# Patient Record
Sex: Female | Born: 1940 | Race: White | Hispanic: No | Marital: Married
Health system: Southern US, Community
[De-identification: ages and names within clinical notes are randomized; demographics above are authoritative.]

## PROBLEM LIST (undated history)

## (undated) DIAGNOSIS — S42492A Other displaced fracture of lower end of left humerus, initial encounter for closed fracture: Secondary | ICD-10-CM

## (undated) DIAGNOSIS — Z1211 Encounter for screening for malignant neoplasm of colon: Secondary | ICD-10-CM

## (undated) DIAGNOSIS — S82002A Unspecified fracture of left patella, initial encounter for closed fracture: Secondary | ICD-10-CM

## (undated) DIAGNOSIS — R4182 Altered mental status, unspecified: Secondary | ICD-10-CM

---

## 1997-06-27 ENCOUNTER — Other Ambulatory Visit: Admission: RE | Admit: 1997-06-27 | Discharge: 1997-06-27 | Payer: Self-pay | Admitting: Gynecology

## 1998-10-17 ENCOUNTER — Ambulatory Visit (HOSPITAL_COMMUNITY): Admission: RE | Admit: 1998-10-17 | Discharge: 1998-10-17 | Payer: Self-pay | Admitting: Orthopedic Surgery

## 1999-03-19 ENCOUNTER — Encounter: Admission: RE | Admit: 1999-03-19 | Discharge: 1999-03-19 | Payer: Self-pay | Admitting: Gynecology

## 1999-03-19 ENCOUNTER — Encounter: Payer: Self-pay | Admitting: Gynecology

## 2000-02-03 ENCOUNTER — Other Ambulatory Visit: Admission: RE | Admit: 2000-02-03 | Discharge: 2000-02-03 | Payer: Self-pay | Admitting: Gynecology

## 2000-03-20 ENCOUNTER — Encounter: Admission: RE | Admit: 2000-03-20 | Discharge: 2000-03-20 | Payer: Self-pay | Admitting: Hematology & Oncology

## 2000-03-20 ENCOUNTER — Encounter: Payer: Self-pay | Admitting: Gynecology

## 2003-07-13 ENCOUNTER — Encounter: Admission: RE | Admit: 2003-07-13 | Discharge: 2003-07-13 | Payer: Self-pay | Admitting: Internal Medicine

## 2003-08-08 ENCOUNTER — Emergency Department (HOSPITAL_COMMUNITY): Admission: EM | Admit: 2003-08-08 | Discharge: 2003-08-08 | Payer: Self-pay | Admitting: Emergency Medicine

## 2004-03-29 ENCOUNTER — Ambulatory Visit (HOSPITAL_BASED_OUTPATIENT_CLINIC_OR_DEPARTMENT_OTHER): Admission: RE | Admit: 2004-03-29 | Discharge: 2004-03-29 | Payer: Self-pay | Admitting: Orthopedic Surgery

## 2004-03-29 ENCOUNTER — Ambulatory Visit (HOSPITAL_COMMUNITY): Admission: RE | Admit: 2004-03-29 | Discharge: 2004-03-29 | Payer: Self-pay | Admitting: Orthopedic Surgery

## 2004-09-07 IMAGING — CT CT HEAD W/O CM
2 of 3 series · 14 of 33 positions shown, 18 images · non-contrast
Comparison: none.

CLINICAL DATA: altered level of consciousness 
 CT HEAD WITHOUT CONTRAST

[Series 2: — · sagittal · 0.46mm/px · 1 of 3 slices shown (1 of 2)]
[im 2/3  brain]
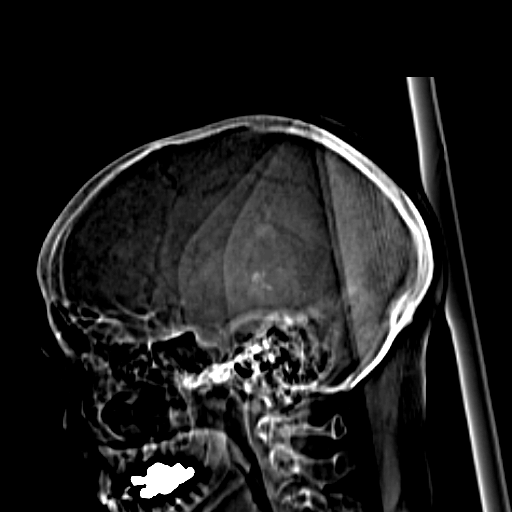

[Series 3: — · axial · 0.43mm/px · z∈[-162,-52]mm · 13 of 26 slices shown, 17 images (2 of 2)]
[im 2/26  brain]
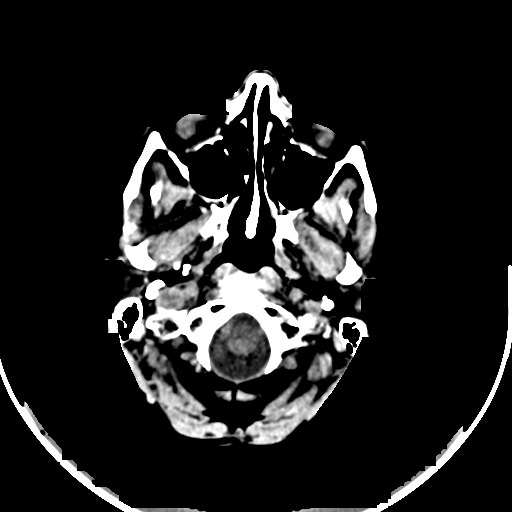
[im 2/26  bone]
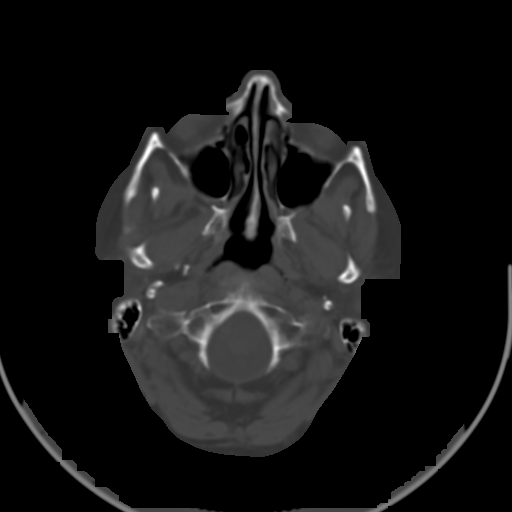
[im 4/26  brain]
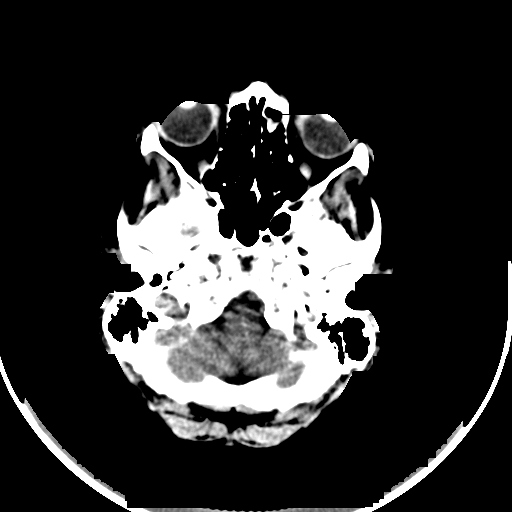
[im 6/26  brain]
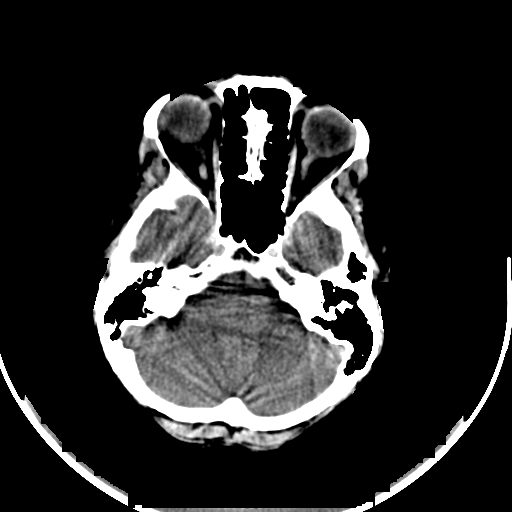
[im 8/26  brain]
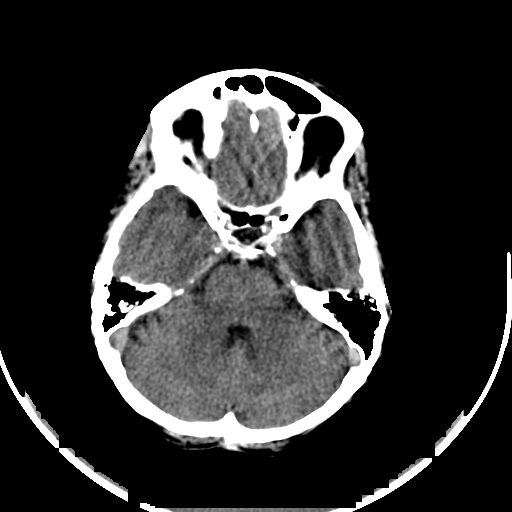
[im 9/26  brain]
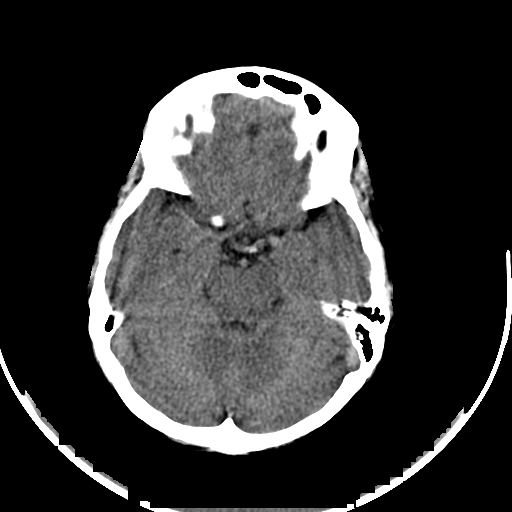
[im 9/26  bone]
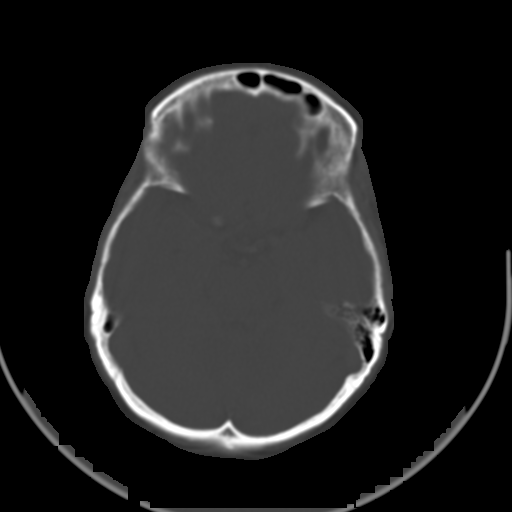
[im 11/26  brain]
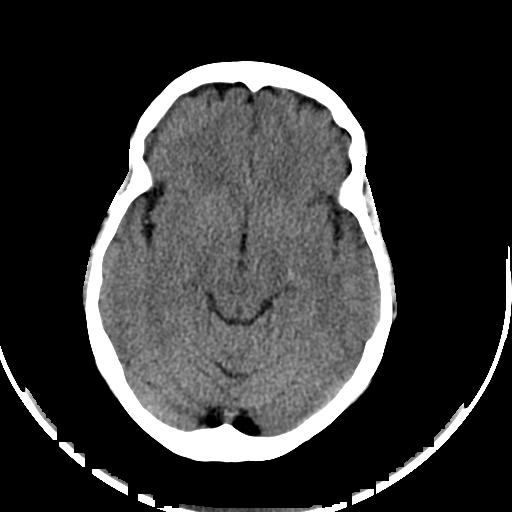
[im 13/26  brain]
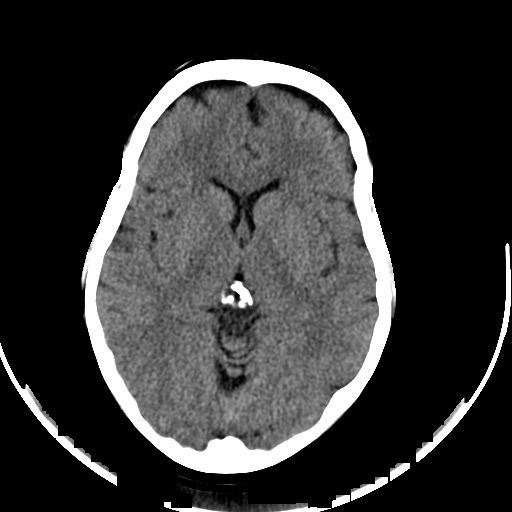
[im 15/26  brain]
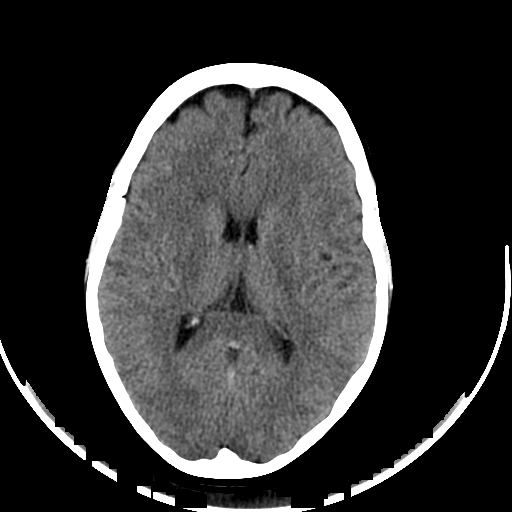
[im 17/26  brain]
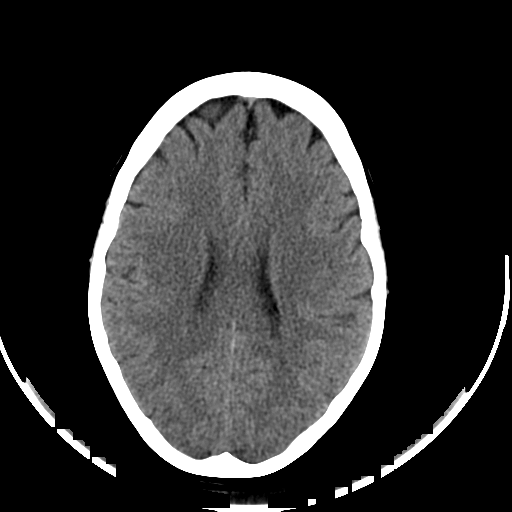
[im 17/26  bone]
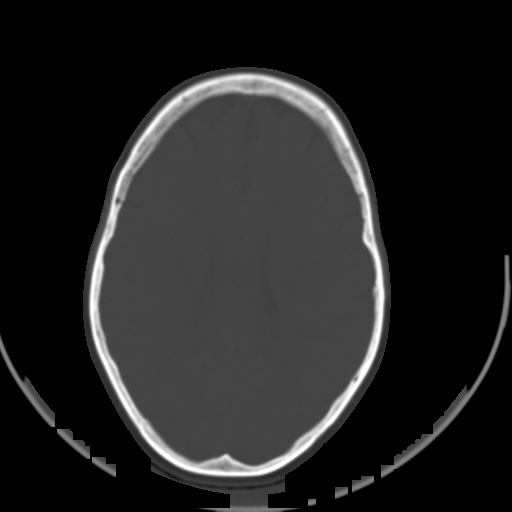
[im 18/26  brain]
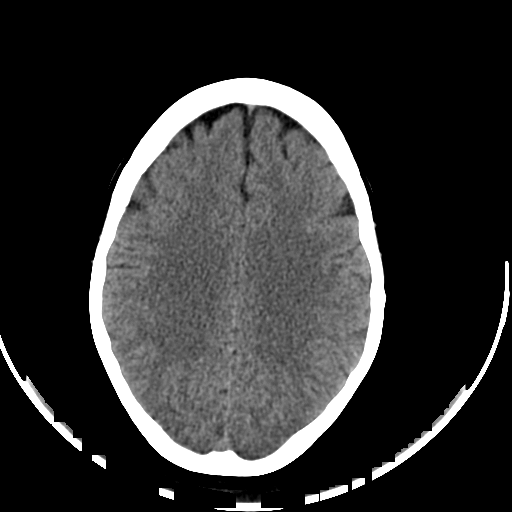
[im 20/26  brain]
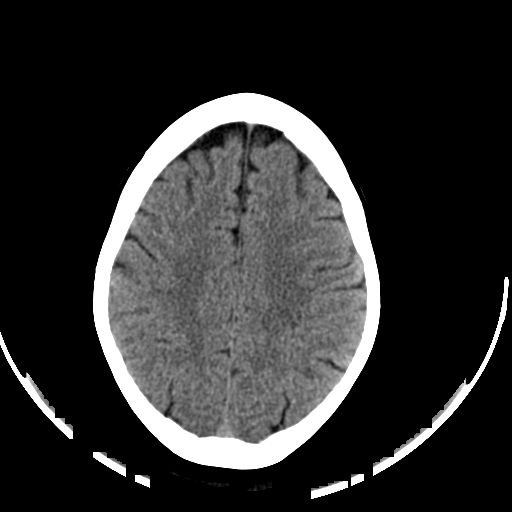
[im 22/26  brain]
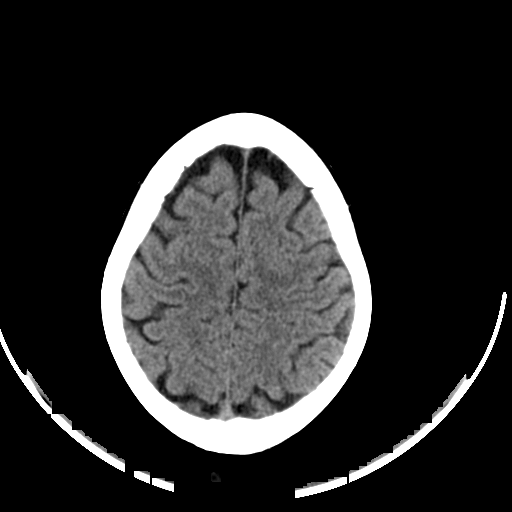
[im 24/26  brain]
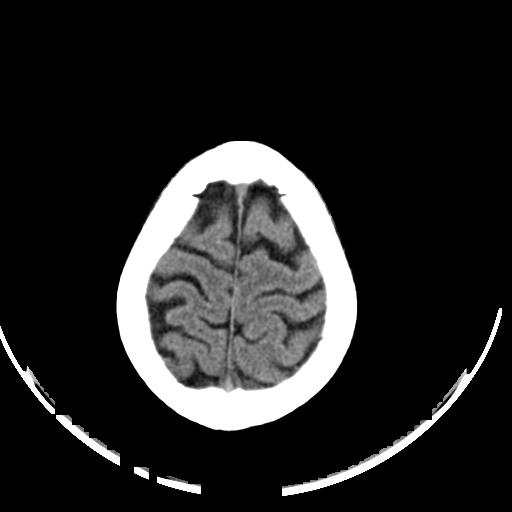
[im 24/26  bone]
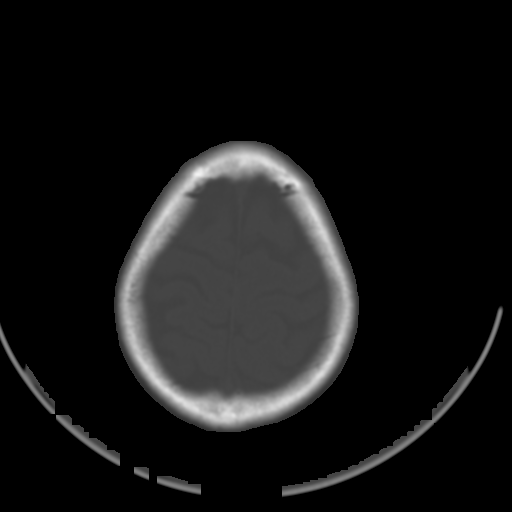

[14 of 33 positions shown; findings below may reference images not displayed]

Routine noncontrast head CT was performed. 

 There is no evidence of intracranial hemorrhage, brain edema, or mass effect. The ventricles are normal. No extra-axial abnormalities are identified. Bone windows show no significant abnormalities.

 IMPRESSION
 Negative noncontrast head CT.

## 2008-12-14 LAB — CBC WITH AUTOMATED DIFF
ABS. BASOPHILS: 0 10*3/uL (ref 0.0–0.1)
ABS. EOSINOPHILS: 0 10*3/uL (ref 0.0–0.4)
ABS. LYMPHOCYTES: 0.5 10*3/uL — ABNORMAL LOW (ref 0.8–3.5)
ABS. MONOCYTES: 0.3 10*3/uL (ref 0.0–1.0)
ABS. NEUTROPHILS: 5.7 10*3/uL (ref 1.8–8.0)
BASOPHILS: 0 % (ref 0–1)
EOSINOPHILS: 0 % (ref 0–7)
HCT: 44.2 % (ref 35.0–47.0)
HGB: 15 g/dL (ref 11.5–16.0)
LYMPHOCYTES: 8 % — ABNORMAL LOW (ref 12–49)
MCH: 30.8 PG (ref 26.0–34.0)
MCHC: 33.9 g/dL (ref 30.0–36.5)
MCV: 90.8 FL (ref 80.0–99.0)
MONOCYTES: 4 % — ABNORMAL LOW (ref 5–13)
NEUTROPHILS: 88 % — ABNORMAL HIGH (ref 32–75)
PLATELET: 314 10*3/uL (ref 150–400)
RBC: 4.87 M/uL (ref 3.80–5.20)
RDW: 12.8 % (ref 11.5–14.5)
WBC: 6.5 10*3/uL (ref 3.6–11.0)

## 2008-12-14 LAB — METABOLIC PANEL, COMPREHENSIVE
A-G Ratio: 1.3 (ref 1.1–2.2)
ALT (SGPT): 21 U/L (ref 12–78)
AST (SGOT): 17 U/L (ref 15–37)
Albumin: 4.6 g/dL (ref 3.5–5.0)
Alk. phosphatase: 77 U/L (ref 50–136)
Anion gap: 10 mmol/L (ref 5–15)
BUN/Creatinine ratio: 24 — ABNORMAL HIGH (ref 12–20)
BUN: 19 MG/DL (ref 6–20)
Bilirubin, total: 0.4 MG/DL (ref 0.2–1.0)
CO2: 24 MMOL/L (ref 21–32)
Calcium: 9.8 MG/DL (ref 8.5–10.1)
Chloride: 101 MMOL/L (ref 97–108)
Creatinine: 0.8 MG/DL (ref 0.6–1.3)
GFR est AA: >60 mL/min/{1.73_m2} (ref >60)
GFR est non-AA: >60 mL/min/{1.73_m2} (ref >60)
Globulin: 3.5 g/dL (ref 2.0–4.0)
Glucose: 130 MG/DL — ABNORMAL HIGH (ref 65–100)
Potassium: 3.5 MMOL/L (ref 3.5–5.1)
Protein, total: 8.1 g/dL (ref 6.4–8.2)
Sodium: 135 MMOL/L — ABNORMAL LOW (ref 136–145)

## 2008-12-14 LAB — CK W/ REFLX CKMB: CK: 65 U/L (ref 21–215)

## 2008-12-14 LAB — TROPONIN I: Troponin-I, Qt.: <0.04 ng/mL (ref <0.05)

## 2008-12-14 LAB — PROTHROMBIN TIME + INR
INR: 1.1 (ref 0.9–1.1)
Prothrombin time: 11.3 SECS — ABNORMAL HIGH (ref 9.0–11.0)

## 2008-12-14 LAB — PTT: aPTT: 26.7 s (ref 24.0–33.0)

## 2008-12-14 NOTE — H&P (Signed)
Name: Brandy White, Brandy White Admitted: 12/14/2008  MR #: [id number] DOB: November 29, 1940  Account #: [id number] Age: 68  Physician: Burt Ek, MD Location:     HISTORY PHYSICAL      PRIMARY CARE PROVIDER: Palma Holter, MD    SOURCE OF INFORMATION: Patient.    CHIEF COMPLAINT: Vertigo.    HISTORY OF PRESENT ILLNESS: A 68 year old female with a history of  hyperlipidemia, orthostatic hypotension, leading to multiple syncopal  episodes in 2006 with negative workup, now on fludrocortisone, vertigo,  depression, presents with vertigo. She was in her usual state of health  until yesterday around 5 p.m., when she developed severe progressive  vertigo associated with nausea. Today, around 1 p.m., symptoms were  associated with severe generalized headache, which she described as the  worst headache of her life. It was subsequently followed with several  episodes of emesis and retching with the last emesis becoming  blood-streaked. This prompted her to visit an urgent care center, where she  was found with a systolic blood pressure of 170/100 and tachycardic to the  130s, prompting them to send her to the emergency department.    She also reports an associated diplopia. She is denying a history of  hypertension, a history of stroke, history of diabetes mellitus. There has  been no tinnitus or hearing loss. Of note, is the fact that over the past 4  to 5 months, she has had several episodes of vertigo associated with  nausea. She is also denying any recent upper respiratory tract infection,  except for a few days of rhinorrhea. There has been no chest pain or  dyspnea. No GI symptoms. No GU symptoms.    REVIEW OF SYSTEMS: All 10 systems have been reviewed and is negative  except for what has been mentioned in HPI.    PAST MEDICAL HISTORY  1. Several syncopal episodes in 2006. This was eventually attributed to  orthostatic hypotension and the patient was started on fludrocortisone for   this. During that time, she underwent 2 stress tests, which were negative.  She also underwent a cardiac catheterization, which was normal and  tilt-table testing, which was also negative. Her workup occurred at [facility].  2. Hyperlipidemia, which is controlled.  3. Osteoporosis.  4. GERD.  5. Depression.    PAST SURGICAL HISTORY  1. TAH/BSO at age 28 for abnormal uterine bleeding.  2. Wrist fracture in 2006 with titanium rod placement. She sustained this  fracture after a syncopal episode.    SOCIAL HISTORY: She lives at home with her husband. She is employed as a  IT consultant. There has been no occupational exposures. She denies tobacco use  or recreational drug use. She drinks occasionally.    FAMILY HISTORY: Mother had a stroke at age 18. She is deceased at age 56.  Father died young from suicide. A brother had a myocardial infarction in  his 72s.    ALLERGIES: NO KNOWN DRUG ALLERGIES.    MEDICATIONS  1. Lipitor. The patient cannot remember the dose, but she believes it is  either 10 or 20 mg daily.  2. Fosamax 70 mg p.o. every week on Mondays.  3. Aspirin 81 mg p.o. daily.  4. Prilosec OTC daily. Repeat dose unknown.  5. Fludrocortisone dose unknown.    PHYSICAL EXAMINATION  VITAL SIGNS: Temperature 97.9, pulse 101, blood pressure 177/115,  respiratory rate 14, O2 95% on room air.  GENERAL: The patient was seen on the ED stretcher. She was not in acute  distress. She is speaking in full sentences.  HEENT: PERRL, EOMI. Oropharynx clear.  NECK: Supple.  CARDIOVASCULAR: S1, S2. Normal rate. Regular rhythm.  PULMONARY: Clear to auscultation bilaterally.  ABDOMEN: Bowel sounds are present, soft, nontender, nondistended.  EXTREMITIES: Are warm. There is no edema.  NEUROLOGIC: Cranial nerves 2-12 intact. Strength 5/5 bilateral upper  extremities. Normal sensory exam. Finger-to-nose testing is normal.    LABORATORY DATA: WBC 6.5, hemoglobin 15.0, hematocrit 44.2, platelets 314,   neutrophils 88, lymphocytes 8, monos 4, eos 0, basophils 0. INR 1.1. PTT  26.7, sodium 135, potassium 3.5, chloride 101, CO2 24, BUN 19, creatinine  0.8, glucose 130, total protein 8.1, albumin 4.6, total bilirubin 0.47,  alkaline phosphatase 77, AST 17, ALT 21, troponin less than 0.04. CPK 65.    RADIOLOGY: Chest x-ray: No acute disease.    CT head: Negative exam.    ASSESSMENT AND PLAN  A 68 year old female with vertigo and nausea over the past 5 months,  presents with an acute onset of vertigo associated with severe headache,  nausea and vomiting. Differential diagnosis includes benign positional  vertigo, which is a likely possibility as patient's vertigo has been  positional and she is feeling much better now in the ED after receiving  meclizine, Zofran and morphine in the emergency department. Other real  differentials include vertebral basilar insufficiency, posterior  circulation stroke, which is also a possibility considering her family  history of early stroke and cardiovascular disease, labyrinthitis could be  a possibility in the setting of recent rhinorrhea, though she does not  describe any classic symptoms of an upper viral respiratory tract  infection. The plan for management, includes ruling out the most  life-threatening causes of her symptoms, which is a stroke. We will obtain  an MRI, MRA brain, carotid Doppler, obtain a 2D echo. We will resume her  statin and aspirin. Also obtain a neurological evaluation. In the interim,  we will treat her symptoms with meclizine as needed.  2. Hyperlipidemia-resume her Lipitor.  3. Gastroesophageal reflux disease. Resume her proton pump inhibitor.  4. Osteoporosis resume Fosamax.  5. Depression. Resume home meds once the dosage is available.        E-Signed By  Burt Ek, MD 01/17/2009 13:25    Burt Ek, MD    cc: Burt Ek, MD        [place]; D: 12/14/2008 9:22 P; T: 12/14/2008 11:03 P; DOC# [id number]; Job#  [id number]

## 2008-12-15 LAB — CBC WITH AUTOMATED DIFF
ABS. BASOPHILS: 0 10*3/uL (ref 0.0–0.1)
ABS. EOSINOPHILS: 0 10*3/uL (ref 0.0–0.4)
ABS. LYMPHOCYTES: 1 10*3/uL (ref 0.8–3.5)
ABS. MONOCYTES: 0.6 10*3/uL (ref 0.0–1.0)
ABS. NEUTROPHILS: 4.4 10*3/uL (ref 1.8–8.0)
BASOPHILS: 1 % (ref 0–1)
EOSINOPHILS: 1 % (ref 0–7)
HCT: 38.7 % (ref 35.0–47.0)
HGB: 12.7 g/dL (ref 11.5–16.0)
LYMPHOCYTES: 17 % (ref 12–49)
MCH: 30.2 PG (ref 26.0–34.0)
MCHC: 32.8 g/dL (ref 30.0–36.5)
MCV: 92.1 FL (ref 80.0–99.0)
MONOCYTES: 10 % (ref 5–13)
NEUTROPHILS: 71 % (ref 32–75)
PLATELET: 298 10*3/uL (ref 150–400)
RBC: 4.2 M/uL (ref 3.80–5.20)
RDW: 12.9 % (ref 11.5–14.5)
WBC: 6.1 10*3/uL (ref 3.6–11.0)

## 2008-12-15 LAB — METABOLIC PANEL, BASIC
Anion gap: 10 mmol/L (ref 5–15)
BUN/Creatinine ratio: 20 (ref 12–20)
BUN: 14 MG/DL (ref 6–20)
CO2: 27 MMOL/L (ref 21–32)
Calcium: 8.6 MG/DL (ref 8.5–10.1)
Chloride: 105 MMOL/L (ref 97–108)
Creatinine: 0.7 MG/DL (ref 0.6–1.3)
GFR est AA: >60 mL/min/{1.73_m2} (ref >60)
GFR est non-AA: >60 mL/min/{1.73_m2} (ref >60)
Glucose: 84 MG/DL (ref 65–100)
Potassium: 3.6 MMOL/L (ref 3.5–5.1)
Sodium: 142 MMOL/L (ref 136–145)

## 2008-12-15 LAB — PHOSPHORUS: Phosphorus: 4.2 MG/DL (ref 2.5–4.9)

## 2008-12-15 LAB — MAGNESIUM: Magnesium: 2.2 MG/DL (ref 1.6–2.4)

## 2008-12-15 NOTE — Consults (Addendum)
Name: Brandy White, Brandy White Admitted: 12/14/2008  MR #: [id number] DOB: 07-19-1940  Account #: [id number] Age: 68  Consultant: Nestor Lewandowsky, MD Location: [facility]     CONSULTATION REPORT    DATE OF CONSULTATION: 12/15/2008      CHIEF COMPLAINT: Dizziness.    HISTORY OF PRESENT ILLNESS: The patient is a 68 year old woman for whom  consultation has been requested by the hospitalist regarding the above  problem. The patient reports that for a few weeks she has been having some  episodes of vertigo which seem to be gradually getting worse. The day  before yesterday, she developed vertigo in the evening. She developed  blurred vision and double vision, which she describes as a shadow if she  looks at numbers on a page. The next day the vertigo worsened and she  developed a severe, diffuse headache with nausea and vomiting. The  dizziness is worse if she turns her head in any direction. She still has  some headache and dizziness, although both are doing a little bit better  today. She denies any focal neurological symptoms. She feels weak in  general. No recent viral symptoms.    She does have a history of syncope with an extensive workup, finally  diagnosed as orthostatic hypotension. She saw Dr. Woodroe Chen from ENT at that  time. She denies much in the way of headaches in the past, although she did  have some headaches with these prior episodes of vertigo in the last few  weeks.    PAST MEDICAL HISTORY  1. Syncope and orthostatic hypotension.  2. Hyperlipidemia.  3. Gastroesophageal reflux disease.  4. Depression.  5. Hysterectomy.  6. Right wrist fracture in the setting of a syncopal episode.    SOCIAL HISTORY: No tobacco or significant alcohol.    FAMILY HISTORY: Mother had a stroke.    ALLERGIES: NONE.    MEDICATIONS  1. Lipitor 20 mg daily.  2. Fosamax 70 mg weekly.  3. Prilosec OTC.  4. Fludrocortisone.  5. Aspirin 81 mg daily.  6. She has been treated with meclizine here as well.     REVIEW OF SYSTEMS: As noted in HPI and PMH. No fevers. Does not sleep  particularly well. All other systems are reviewed and are negative.    PHYSICAL EXAMINATION  GENERAL: A middle-aged, properly groomed woman.  VITAL SIGNS: Temperature 98.4, pulse 62, respirations 18, blood pressure  115/98.  NECK: Without bruits.  HEART: Regular rate and rhythm.  FUNDI: Poorly visualized.  NEUROLOGICAL: Alert, fully oriented, attentive and without aphasia. Full  fund of knowledge. Normal recent and remote memory. Pupils equal, round,  and reactive to light. Visual fields full. Extraocular movements intact.  Face symmetric. Tongue midline. Palate elevates symmetrically. Normal  sensation on the face. No drift. Normal coordination. Normal tone, bulk,  and strength. DTRs 2+ throughout with absent ankle jerks bilaterally. Toes  downgoing. Normal temperature and vibration sensation. Her gait is a little  bit slow but fairly steady. Dix-Hallpike maneuver produces some vertigo  with her head in both directions, but I do not see any nystagmus.    DATA: Hospital records were reviewed. They describe dizziness and severe  headache.    Laboratory studies were unremarkable.    Images and report from a CT scan of the head were reviewed. This was  normal. Carotid Dopplers show 0% to 49% stenosis on the left and a normal  right carotid artery.    IMPRESSION  1. Episodes of vertigo,  headache and diplopia. Could be benign positional  vertigo, although the very severe headache would be unusual. Migraine  associated vertigo is also possible but a diagnosis of exclusion,  particularly in somebody this age. We should rule out stroke. Need to rule  out aneurysm.  2. History of orthostatic hypotension.  3. Dyslipidemia.    PLAN  1. MRI scan of the brain and MRA of the head.  2. Continue aspirin and Lipitor.  3. Meclizine p.r.n.  4. If the MRI is negative, then she may need outpatient followup with ENT  for further evaluation.         Reviewed on 12/15/2008 12:22 PM      E-Signed By  Nestor Lewandowsky, MD 12/15/2008 13:58    Feliciana Rossetti, Montez Hageman, MD    cc: D. Palma Holter, MD   Nestor Lewandowsky, MD    HOSPITALIST TEAM 2    JJW/WMX; D: 12/15/2008 11:07 A; T: 12/15/2008 12:15 P; DOC# [id number]; Job#  [id number]

## 2008-12-16 LAB — CBC WITH AUTOMATED DIFF
ABS. BASOPHILS: 0 10*3/uL (ref 0.0–0.1)
ABS. EOSINOPHILS: 0.2 10*3/uL (ref 0.0–0.4)
ABS. LYMPHOCYTES: 1.1 10*3/uL (ref 0.8–3.5)
ABS. MONOCYTES: 0.5 10*3/uL (ref 0.0–1.0)
ABS. NEUTROPHILS: 2.9 10*3/uL (ref 1.8–8.0)
BASOPHILS: 1 % (ref 0–1)
EOSINOPHILS: 4 % (ref 0–7)
HCT: 42.6 % (ref 35.0–47.0)
HGB: 13.7 g/dL (ref 11.5–16.0)
LYMPHOCYTES: 23 % (ref 12–49)
MCH: 30.2 PG (ref 26.0–34.0)
MCHC: 32.2 g/dL (ref 30.0–36.5)
MCV: 94 FL (ref 80.0–99.0)
MONOCYTES: 10 % (ref 5–13)
NEUTROPHILS: 62 % (ref 32–75)
PLATELET: 284 10*3/uL (ref 150–400)
RBC: 4.53 M/uL (ref 3.80–5.20)
RDW: 13.1 % (ref 11.5–14.5)
WBC: 4.8 10*3/uL (ref 3.6–11.0)

## 2008-12-16 LAB — METABOLIC PANEL, COMPREHENSIVE
A-G Ratio: 1.1 (ref 1.1–2.2)
ALT (SGPT): 17 U/L (ref 12–78)
AST (SGOT): 12 U/L — ABNORMAL LOW (ref 15–37)
Albumin: 3.5 g/dL (ref 3.5–5.0)
Alk. phosphatase: 76 U/L (ref 50–136)
Anion gap: 12 mmol/L (ref 5–15)
BUN/Creatinine ratio: 19 (ref 12–20)
BUN: 17 MG/DL (ref 6–20)
Bilirubin, total: 0.4 MG/DL (ref 0.2–1.0)
CO2: 26 MMOL/L (ref 21–32)
Calcium: 8.9 MG/DL (ref 8.5–10.1)
Chloride: 105 MMOL/L (ref 97–108)
Creatinine: 0.9 MG/DL (ref 0.6–1.3)
GFR est AA: >60 mL/min/{1.73_m2} (ref >60)
GFR est non-AA: >60 mL/min/{1.73_m2} (ref >60)
Globulin: 3.2 g/dL (ref 2.0–4.0)
Glucose: 77 MG/DL (ref 65–100)
Potassium: 3.8 MMOL/L (ref 3.5–5.1)
Protein, total: 6.7 g/dL (ref 6.4–8.2)
Sodium: 143 MMOL/L (ref 136–145)

## 2008-12-16 NOTE — Discharge Summary (Signed)
Name: Brandy White, Brandy White Admitted: 12/14/2008  MR #: [id number] Discharged: 12/16/2008  Account #: [id number] DOB: 08-02-1940  Physician: Ubaldo Glassing, MD Age 68     DISCHARGE SUMMARY      DIAGNOSES  PRINCIPAL DIAGNOSIS: Vertigo.    OTHER DIAGNOSES  1. His syncopal episode secondary to orthostatic hypotension.  2. Hyperlipidemia.  3. Osteoporosis.  4. Gastroesophageal reflux disease.  5. Depression.    DISCHARGE MEDICATIONS  NEW: Meclizine 25 mg q.8 h. as needed.    UNCHANGED  1. Lipitor, dose unknown.  2. Fosamax 70 mg q. weekly.  3. Aspirin 81 mg daily.  4. Prilosec OTC daily, dose unknown.  5. Fludrocortisone, dose unknown.    FOLLOW-UP CARE: The patient will follow up with Dr. Silvano Bilis, ENT, upon  discharge.    DISPOSITION: The patient is being discharged to home.    IMMUNIZATIONS: Immunization at admission is not available at the time of  this discharge summary dictation.    ADVANCED DIRECTIVES: She is a FULL CODE.    PATIENT INSTRUCTIONS/LIMITATIONS  DIET: Cardiac prudent.  ACTIVITY: Fall precautions.    REASON FOR ADMISSION: Headache, nausea, dizziness, and diplopia.    BRIEF ADMISSION PHYSICAL EXAMINATION ON ADMISSION  VITAL SIGNS: Temperature 97.9, heart rate 101 beats per minute, blood  pressure 177/115, respiratory rate 14, oxygen saturation 95% on room air.  GENERAL: No acute distress. Speaking in full sentences.  NECK: supple.  CARDIOVASCULAR: Normal rate, regular rhythm.  PULMONARY: Clear to auscultation bilaterally.  NEUROLOGIC: Cranial nerves 2 through 12 intact. Strength 5/5 bilateral  upper extremities. Normal sensory exam. Finger-to-nose testing normal.    CONSULTANTS: The patient was seen in consultation by Dr. Maurene Capes,  Neurology.    IMPORTANT LABS AND X-RAYS: A 2-D echocardiogram revealed normal systolic  function with an ejection fraction of 55% to 60%. No regional wall motion  abnormalities.    MRI and MRA of the brain were unremarkable.     Carotid Dopplers revealed 0% to 49% stenosis in the left internal carotid.  Thyroid appeared abnormal with a hypoechoic area on the right, possible  cyst. No evidence of right carotid stenosis.    ASSESSMENT/PLAN: Vertigo. The patient was seen in consultation by  Neurology. A workup was unremarkable. The vertigo was felt to be likely  related to an inner ear disorder. ENT followup was recommended in the  outpatient setting. She will also be given a prescription for meclizine to  take as needed for the dizziness.    On behalf of [employer], thank you for allowing me to  participate in the care of this patient.            E-Signed By  Ubaldo Glassing, MD 01/04/2009 17:47    Ubaldo Glassing, MD    cc: Ubaldo Glassing, MD        MG/WMX; D: 12/16/2008 10:27 A; T: 12/16/2008 10:09 P; DOC# [id number]; Job#  [id number]

## 2012-03-09 ENCOUNTER — Encounter: Payer: Self-pay | Admitting: Internal Medicine

## 2013-03-03 ENCOUNTER — Ambulatory Visit: Payer: Medicare Other | Admitting: Physical Therapy

## 2014-03-28 ENCOUNTER — Inpatient Hospital Stay: Payer: MEDICARE

## 2014-03-28 MED ORDER — NALOXONE 0.4 MG/ML INJECTION
0.4 mg/mL | INTRAMUSCULAR | Status: DC | PRN
Start: 2014-03-28 — End: 2014-03-28

## 2014-03-28 MED ORDER — SODIUM CHLORIDE 0.9 % IV
INTRAVENOUS | Status: DC
Start: 2014-03-28 — End: 2014-03-28
  Administered 2014-03-28: 14:00:00 via INTRAVENOUS

## 2014-03-28 MED ORDER — LACTATED RINGERS IV
INTRAVENOUS | Status: DC | PRN
Start: 2014-03-28 — End: 2014-03-28
  Administered 2014-03-28: 14:00:00 via INTRAVENOUS

## 2014-03-28 MED ORDER — MIDAZOLAM 1 MG/ML IJ SOLN
1 mg/mL | INTRAMUSCULAR | Status: DC | PRN
Start: 2014-03-28 — End: 2014-03-28

## 2014-03-28 MED ORDER — PROPOFOL 10 MG/ML IV EMUL
10 mg/mL | INTRAVENOUS | Status: DC | PRN
Start: 2014-03-28 — End: 2014-03-28
  Administered 2014-03-28 (×3): via INTRAVENOUS

## 2014-03-28 MED ORDER — FENTANYL CITRATE (PF) 50 MCG/ML IJ SOLN
50 mcg/mL | INTRAMUSCULAR | Status: DC | PRN
Start: 2014-03-28 — End: 2014-03-28

## 2014-03-28 MED ORDER — SODIUM CHLORIDE 0.9 % IJ SYRG
INTRAMUSCULAR | Status: DC | PRN
Start: 2014-03-28 — End: 2014-03-28

## 2014-03-28 MED ORDER — FLUMAZENIL 0.1 MG/ML IV SOLN
0.1 mg/mL | INTRAVENOUS | Status: DC | PRN
Start: 2014-03-28 — End: 2014-03-28

## 2014-03-28 MED ORDER — ATROPINE 0.1 MG/ML SYRINGE
0.1 mg/mL | Freq: Once | INTRAMUSCULAR | Status: DC | PRN
Start: 2014-03-28 — End: 2014-03-28

## 2014-03-28 MED ORDER — EPINEPHRINE 0.1 MG/ML SYRINGE
0.1 mg/mL | Freq: Once | INTRAMUSCULAR | Status: DC | PRN
Start: 2014-03-28 — End: 2014-03-28

## 2014-03-28 MED ORDER — SODIUM CHLORIDE 0.9 % IJ SYRG
Freq: Three times a day (TID) | INTRAMUSCULAR | Status: DC
Start: 2014-03-28 — End: 2014-03-28

## 2014-03-28 MED ORDER — FENTANYL CITRATE (PF) 50 MCG/ML IJ SOLN
50 mcg/mL | INTRAMUSCULAR | Status: DC | PRN
Start: 2014-03-28 — End: 2014-03-28
  Administered 2014-03-28: 14:00:00 via INTRAVENOUS

## 2014-03-28 MED ORDER — SIMETHICONE 40 MG/0.6 ML ORAL DROPS, SUSP
40 mg/0.6 mL | ORAL | Status: DC | PRN
Start: 2014-03-28 — End: 2014-03-28

## 2014-03-28 MED FILL — DIPRIVAN 10 MG/ML INTRAVENOUS EMULSION: 10 mg/mL | INTRAVENOUS | Qty: 24

## 2014-03-28 MED FILL — SODIUM CHLORIDE 0.9 % IV: INTRAVENOUS | Qty: 200

## 2014-03-28 MED FILL — SODIUM CHLORIDE 0.9 % IV: INTRAVENOUS | Qty: 1000

## 2014-03-28 NOTE — Procedures (Signed)
[facility]  [address]  [place], [place] [id number]                 Colonoscopy Procedure Note    Indications:   See Preoperative Diagnosis above  Referring Physician: Theresia LoANIEL WESLEY DAVID, MD  Anesthesia/Sedation: MAC anesthesia Propofol  Endoscopist:  Dr. Alma DownsPuneet Tationna Fullard  Assistant:  Endoscopy Technician-1: Barkley BoardsJoe Durham  Endoscopy RN-1: Donneta RombergMichele M Feeney, RN    Procedure in Detail:  Informed consent was obtained for the procedure, including sedation.  Risks of perforation, hemorrhage, adverse drug reaction, and aspiration were discussed. The patient was placed in the left lateral decubitus position.  Based on the pre-procedure assessment, including review of the patient's medical history, medications, allergies, and review of systems, she had been deemed to be an appropriate candidate for moderate sedation; she was therefore sedated with the medications listed above.   The patient was monitored continuously with ECG tracing, pulse oximetry, blood pressure monitoring, and direct observations.      A rectal examination was performed. The CFQ180AL was inserted into the rectum and advanced under direct vision to the cecum, which was identified by the ileocecal valve and appendiceal orifice.  The quality of the colonic preparation was good.  A careful inspection was made as the colonoscope was withdrawn, including a retroflexed view of the rectum; findings and interventions are described below.  Appropriate photodocumentation was obtained.    Findings:  Rectum: normal  Sigmoid: normal  Descending Colon: normal  Transverse Colon: normal  Ascending Colon: normal  Cecum: normal      Specimens:    none    EBL: None    Complications: None; patient tolerated the procedure well.    Recommendations:     - Repeat colonoscopy in 5 years.    Signed By: Alma DownsPuneet  Sherre Wooton, MD                        March 28, 2014

## 2014-03-28 NOTE — H&P (Signed)
Colonoscopy History and Physical      The patient was seen and examined.Date of last colonoscopy: none, Polyps  No      The airway was assessed and documented.  The problem list, past medical history, and medications were reviewed.     There is no problem list on file for this patient.    History     Social History   ??? Marital Status: WIDOWED     Spouse Name: N/A     Number of Children: N/A   ??? Years of Education: N/A     Occupational History   ??? Not on file.     Social History Main Topics   ??? Smoking status: Never Smoker    ??? Smokeless tobacco: Not on file   ??? Alcohol Use: 2.4 oz/week     4 Glasses of wine per week   ??? Drug Use: Not on file   ??? Sexual Activity: Not on file     Other Topics Concern   ??? Not on file     Social History Narrative     Past Medical History   Diagnosis Date   ??? GERD (gastroesophageal reflux disease)    ??? Hypertension    ??? Ill-defined condition      increased cholesterol   ??? Ill-defined condition      osteoporosis/now osteopenia   ??? Adverse effect of anesthesia      takes longer to wake up per pt         Prior to Admission Medications   Prescriptions Last Dose Informant Patient Reported? Taking?   ASPIRIN PO 03/22/2014  Yes Yes   Sig: Take 81 mg by mouth daily.   Desvenlafaxine 100 mg Tb24 03/27/2014 at Unknown time  Yes Yes   Sig: Take 100 mg by mouth daily.   Hydrochlorothiazide (HYDRODIURIL) 12.5 mg tablet 03/27/2014 at Unknown time  Yes Yes   Sig: Take 12.5 mg by mouth daily.   MULTIVITAMIN PO 03/27/2014 at Unknown time  Yes Yes   Sig: Take  by mouth. Takes one po daily.   alendronate (FOSAMAX) 70 mg tablet   Yes Yes   Sig: Take 70 mg by mouth every Sunday.   atorvastatin (LIPITOR) 20 mg tablet 03/27/2014 at Unknown time  Yes Yes   Sig: Take 20 mg by mouth daily.   cholecalciferol, vitamin D3, 2,000 unit tab 03/27/2014 at Unknown time  Yes Yes   Sig: Take 2,000 Units by mouth daily.   cyanocobalamin (VITAMIN B12) 100 mcg tablet 03/27/2014 at Unknown time  Yes Yes    Sig: Take 100 mcg by mouth daily.   fludrocortisone (FLORINEF) 0.1 mg tablet 03/27/2014 at Unknown time  Yes Yes   Sig: Take 0.1 mg by mouth daily.   omeprazole (PRILOSEC OTC) 20 mg tablet 03/27/2014 at Unknown time  Yes Yes   Sig: Take 20 mg by mouth daily.      Facility-Administered Medications: None       The patient was seen and examined in the endoscopy suite. The airway was assessed and docuemented. The problem list and medications were reviewed.     Chief complaint, history of present illness, and review of systems and Past medical History are positive for: HTN, GERD and family history of colon cancer    The heart, lungs and mental status were satisfactory for the administration of sedation and for the procedure.     I discussed with the patient the objectives, risks, consequences and alternatives to the  procedure.     Plan: Endoscopic procedure with sedation     Alma Downs, MD   03/28/2014  9:21 AM

## 2014-03-28 NOTE — Other (Signed)
Brandy White  03-May-1940  [id number]    Situation:  Verbal report received from: M. Alonna BucklerFeeney, RN  Procedure: Procedure(s):  COLONOSCOPY    Background:    Preoperative diagnosis: FAM HX OF COLON CANCER  Postoperative diagnosis: hemorrhoids    Operator:  Dr. Lucianne MussKumar  Assistant(s): Endoscopy Technician-1: Barkley BoardsJoe Durham  Endoscopy RN-1: Donneta RombergMichele M Feeney, RN    Specimens: * No specimens in log *  H. Pylori  no    Assessment:  Intra-procedure medications     Anesthesia gave intra-procedure sedation and medications, see anesthesia flow sheet yes    Intravenous fluids: NS@ KVO     Vital signs stable     Abdominal assessment: round and soft     Recommendation:  Discharge patient per MD order.    Family or Friend   Permission to share finding with family or friend yes

## 2014-03-28 NOTE — Anesthesia Post-Procedure Evaluation (Signed)
Post-Anesthesia Evaluation and Assessment    Patient: Sherlon HandingFrances C Mcqueary MRN: [id number]  SSN: [id number]    Date of Birth: October 13, 1940  Age: 74 y.o.  Sex: female       Cardiovascular Function/Vital Signs  Visit Vitals   Item Reading   ??? BP 159/76 mmHg   ??? Pulse 59   ??? Temp 36.4 ??C (97.5 ??F)   ??? Resp 18   ??? Ht 5\' 1"  (1.549 m)   ??? Wt 72.576 kg (160 lb)   ??? BMI 30.25 kg/m2   ??? SpO2 98%   ??? Breastfeeding No       Patient is status post MAC anesthesia for Procedure(s):  COLONOSCOPY.    Nausea/Vomiting: None    Postoperative hydration reviewed and adequate.    Pain:  Pain Scale 1: Numeric (0 - 10) (03/28/14 1010)  Pain Intensity 1: 0 (03/28/14 1010)   Managed    Neurological Status:       At baseline    Mental Status and Level of Consciousness: Alert and oriented     Pulmonary Status:   O2 Device: Room air (03/28/14 1010)   Adequate oxygenation and airway patent    Complications related to anesthesia: None    Post-anesthesia assessment completed. No concerns    Signed By: Rexene EdisonMary K Darryel Diodato, MD     March 28, 2014

## 2014-03-28 NOTE — Anesthesia Pre-Procedure Evaluation (Signed)
Anesthetic History   No history of anesthetic complications            Review of Systems / Medical History  Patient summary reviewed, nursing notes reviewed and pertinent labs reviewed    Pulmonary  Within defined limits                 Neuro/Psych   Within defined limits           Cardiovascular    Hypertension          Hyperlipidemia         GI/Hepatic/Renal     GERD           Endo/Other  Within defined limits           Other Findings              Physical Exam    Airway  Mallampati: II  TM Distance: 4 - 6 cm  Neck ROM: normal range of motion   Mouth opening: Normal     Cardiovascular  Regular rate and rhythm,  S1 and S2 normal,  no murmur, click, rub, or gallop             Dental  No notable dental hx       Pulmonary  Breath sounds clear to auscultation               Abdominal  GI exam deferred       Other Findings            Anesthetic Plan    ASA: 2  Anesthesia type: MAC          Induction: Intravenous  Anesthetic plan and risks discussed with: Patient

## 2014-03-28 NOTE — Procedures (Signed)
Procedures  by Alma DownsKumar, Taquila Leys, MD at 03/28/14 [phone]                Author: Alma DownsKumar, Dayjah Selman, MD  Service: Gastroenterology  Author Type: Physician       Filed: 03/28/14 0955  Date of Service: 03/28/14 0947  Status: Signed          Editor: Alma DownsKumar, Quinlee Sciarra, MD (Physician)            Pre-procedure Diagnoses        1. Family history of colon cancer [Z80.0]                           Post-procedure Diagnoses        1. Family history of colon cancer [Z80.0]                           Procedures        1. COLONOSCOPY,DIAGNOSTIC [[id number]][[id number]]                              [facility]   [address]   [place], [place] [id number]                       Colonoscopy Procedure Note      Indications:   See Preoperative Diagnosis above   Referring Physician: Theresia LoANIEL WESLEY DAVID, MD   Anesthesia/Sedation: MAC anesthesia Propofol   Endoscopist:  Dr. Alma DownsPuneet Tyris Eliot   Assistant:  Endoscopy Technician-1: Barkley BoardsJoe Durham   Endoscopy RN-1: Donneta RombergMichele M Feeney, RN      Procedure in Detail:   Informed consent was obtained for the procedure, including sedation.  Risks of perforation, hemorrhage, adverse drug reaction, and aspiration were discussed. The patient was placed in the left lateral decubitus position.  Based on the pre-procedure assessment,  including review of the patient's medical history, medications, allergies, and review of systems, she had been deemed to be an appropriate candidate  for moderate sedation; she was therefore sedated with the medications listed above.   The patient was monitored continuously with ECG tracing,  pulse oximetry, blood pressure monitoring, and direct observations.        A rectal examination was performed. The CFQ180AL was inserted into the rectum and advanced under direct vision to the cecum, which was identified by the ileocecal valve and appendiceal orifice.  The quality of the colonic preparation was good.  A careful  inspection was made as the colonoscope was withdrawn, including a  retroflexed view of the rectum; findings and interventions are described below.  Appropriate photodocumentation was obtained.      Findings:   Rectum: normal   Sigmoid: normal   Descending Colon: normal   Transverse Colon: normal   Ascending Colon: normal   Cecum: normal         Specimens:     none      EBL: None      Complications: None; patient tolerated the procedure well.      Recommendations:      - Repeat colonoscopy in 5 years.      Signed By: Alma DownsPuneet  Victory Strollo, MD                         March 28, 2014

## 2015-08-23 ENCOUNTER — Emergency Department

## 2015-08-23 ENCOUNTER — Inpatient Hospital Stay
Admit: 2015-08-23 | Discharge: 2015-08-24 | Disposition: A | Discharge status: Home / Self Care | Payer: MEDICARE | Attending: Emergency Medicine

## 2015-08-23 ENCOUNTER — Emergency Department: Admit: 2015-08-23 | Payer: MEDICARE | Primary: Family Medicine

## 2015-08-23 DIAGNOSIS — S82002A Unspecified fracture of left patella, initial encounter for closed fracture: Secondary | ICD-10-CM

## 2015-08-23 MED ORDER — DIPHTH,PERTUS(AC)TETANUS VAC(PF) 2.5 LF UNIT-8 MCG-5 LF/0.5 ML INJ
INTRAMUSCULAR | Status: AC
Start: 2015-08-23 — End: 2015-08-23
  Administered 2015-08-23: 23:00:00 via INTRAMUSCULAR

## 2015-08-23 MED ORDER — HYDROCODONE-ACETAMINOPHEN 5 MG-325 MG TAB
5-325 mg | ORAL_TABLET | ORAL | 0 refills | Status: DC | PRN
Start: 2015-08-23 — End: 2017-08-17

## 2015-08-23 MED ORDER — HYDROCODONE-ACETAMINOPHEN 5 MG-325 MG TAB
5-325 mg | Freq: Once | ORAL | Status: AC
Start: 2015-08-23 — End: 2015-08-23
  Administered 2015-08-24: via ORAL

## 2015-08-23 MED FILL — BOOSTRIX TDAP 2.5 LF UNIT-8 MCG-5 LF/0.5 ML INTRAMUSCULAR SUSPENSION: INTRAMUSCULAR | Qty: 1

## 2015-08-23 NOTE — ED Notes (Signed)
Received bedside report in SBAR format, updated on MAR, recent results and plan of care from Jennifer, RN. Assumed care of patient.

## 2015-08-23 NOTE — ED Triage Notes (Signed)
Pt. arrives via EMS with complaint of left knee pain post GLF.  Note abrasions to nose and chin upon arrival.  Per EMS pt. tripped after getting off a bus.  Denies LOC.  Also complains of neck/back and right wrist pain.

## 2015-08-23 NOTE — ED Provider Notes (Signed)
HPI Comments: 75 y.o. female with past medical history significant for GERD, hypertension, increased cholesterol, osteoporosis, and left knee fracture [place] with chief complaint of knee pain. Patient states she suffered from a  fall after tripping over a suitcase while attempting to exit a bus approximately 1.5 hours ago. Patient notes she hit her face, which resulted in some road rash and mild pain. Patient states onset of moderate bilateral knee pain that is aggravated upon palpation with the left knee pain greater than the right. Patient also complains of right wrist pain. Patient admits she has not been able to walk since her pain. Patient mentions hx of left knee fracture; however, patient has never had surgery. Patient denies any loss of consciousness during her fall. Patient denies chest pain and shortness of breath. There are no other acute medical concerns at this time.    Social hx: Tobacco Use: No, Alcohol Use: Yes    PCP: Tammi Souaniel Wesley David, MD    Note written by Janeece RiggersJustin H. Nguyen, Scribe, as dictated by Verl BangsJeffrey M Apryl Brymer, MD 6:10 PM      The history is provided by the patient.        Past Medical History:   Diagnosis Date   ??? Adverse effect of anesthesia     takes longer to wake up per pt   ??? Fracture     L knee, no surgery   ??? GERD (gastroesophageal reflux disease)    ??? Hypertension    ??? Ill-defined condition     increased cholesterol   ??? Ill-defined condition     osteoporosis/now osteopenia       Past Surgical History:   Procedure Laterality Date   ??? HX HYSTERECTOMY     ??? HX ORTHOPAEDIC      sugery for fx right wrist - has titanium   ??? HX ORTHOPAEDIC      L shoulder         Family History:   Problem Relation Age of Onset   ??? Cancer Mother      colon   ??? Cancer Sister      melanoma   ??? Cancer Brother      melanoma   ??? Heart Attack Brother    ??? Pacemaker Brother        Social History     Social History   ??? Marital status: WIDOWED     Spouse name: N/A    ??? Number of children: N/A   ??? Years of education: N/A     Occupational History   ??? Not on file.     Social History Main Topics   ??? Smoking status: Never Smoker   ??? Smokeless tobacco: Never Used   ??? Alcohol use 2.4 oz/week     4 Glasses of wine per week   ??? Drug use: Not on file   ??? Sexual activity: Not on file     Other Topics Concern   ??? Not on file     Social History Narrative         ALLERGIES: Review of patient's allergies indicates no known allergies.    Review of Systems   Constitutional: Negative for chills and fever.   HENT: Negative for rhinorrhea and sore throat.    Respiratory: Negative for cough and shortness of breath.    Cardiovascular: Negative for chest pain.   Gastrointestinal: Positive for nausea. Negative for abdominal pain, diarrhea and vomiting.   Genitourinary: Negative  for dysuria and urgency.   Musculoskeletal: Positive for arthralgias, gait problem and myalgias. Negative for back pain.   Skin: Positive for rash (road rash on face).   Neurological: Negative for dizziness, syncope, weakness and light-headedness.   All other systems reviewed and are negative.      Vitals:    08/23/15 1752   BP: 138/77   Pulse: 84   Resp: 18   Temp: 98.2 ??F (36.8 ??C)   SpO2: 100%   Weight: 65.8 kg (145 lb)   Height: 5\' 2"  (1.575 m)            Physical Exam     Const:  No acute distress, well developed, well nourished  Head:  Atraumatic, normocephalic  Eyes:  PERRL, conjunctiva normal, no scleral icterus  Neck:  Supple, trachea midline  Cardiovascular:  RRR, no murmurs, no gallops, no rubs  Resp:  No resp distress, no increased work of breathing, no wheezes, no rhonchi, no rales,  Abd:  Soft, non-tender, non-distended, no rebound, no guarding, no CVA tenderness  GU:  Deferred  MSK:  No pedal edema  Patient has tenderness of bilateral knees.   Patient has some tenderness to the hip.   Mild tenderness to the right wrist.   Neuro:  Alert and oriented x3, no cranial nerve defect  Skin:  Warm, dry   Patient has abrasions over the nose, upper lip, and lower face  Patient has some bruising of the left knee  Psych: normal mood and affect, behavior is normal, judgement and thought content is normal    Note written by Janeece RiggersJustin H. Nguyen, Scribe, as dictated by Verl BangsJeffrey M Tamar Miano, MD 6:11 PM    MDM  Number of Diagnoses or Management Options  Closed nondisplaced fracture of left patella, unspecified fracture morphology, initial encounter:      Amount and/or Complexity of Data Reviewed  Clinical lab tests: ordered and reviewed  Tests in the radiology section of CPT??: ordered and reviewed  Review and summarize past medical records: yes    Patient Progress  Patient progress: stable    ED Course     Pt. Presents to the ER with pain after a fall.  No fx on head or face CT.  No signs/sx of cord compression.  Pt. With patellar fracture.  Pt. Placed in a knee immobilizer.  Pt. To f/u with her PCP or return to the ER with worsening sx.      Procedures

## 2015-08-23 NOTE — ED Notes (Signed)
Patient received discharge instructions by MD and nurse. Patient verbalized understanding of medication use and other discharge instructions.     Updated vitals and patient wheeled out of ED, showing no signs of distress. Pt reports relief of most intense pain. All questions answered.

## 2015-08-24 MED FILL — HYDROCODONE-ACETAMINOPHEN 5 MG-325 MG TAB: 5-325 mg | ORAL | Qty: 1

## 2017-08-17 ENCOUNTER — Inpatient Hospital Stay
Admit: 2017-08-17 | Discharge: 2017-08-17 | Disposition: A | Discharge status: Home / Self Care | Payer: MEDICARE | Attending: Emergency Medicine

## 2017-08-17 ENCOUNTER — Encounter

## 2017-08-17 ENCOUNTER — Emergency Department: Admit: 2017-08-17 | Payer: MEDICARE | Primary: Family Medicine

## 2017-08-17 DIAGNOSIS — S42432A Displaced fracture (avulsion) of lateral epicondyle of left humerus, initial encounter for closed fracture: Secondary | ICD-10-CM

## 2017-08-17 MED ORDER — ONDANSETRON 4 MG TAB, RAPID DISSOLVE
4 mg | ORAL | Status: AC
Start: 2017-08-17 — End: 2017-08-17
  Administered 2017-08-17: 19:00:00 via ORAL

## 2017-08-17 MED ORDER — ACETAMINOPHEN 325 MG TABLET
325 mg | Freq: Four times a day (QID) | ORAL | Status: DC | PRN
Start: 2017-08-17 — End: 2017-08-17
  Administered 2017-08-17: 17:00:00 via ORAL

## 2017-08-17 MED ORDER — ONDANSETRON 4 MG TAB, RAPID DISSOLVE
4 mg | ORAL_TABLET | Freq: Three times a day (TID) | ORAL | 0 refills | Status: AC | PRN
Start: 2017-08-17 — End: ?

## 2017-08-17 MED ORDER — ONDANSETRON 4 MG TAB, RAPID DISSOLVE
4 mg | ORAL | Status: AC
Start: 2017-08-17 — End: 2017-08-17
  Administered 2017-08-17: 18:00:00 via SUBLINGUAL

## 2017-08-17 MED ORDER — HYDROCODONE-ACETAMINOPHEN 5 MG-325 MG TAB
5-325 mg | ORAL | Status: AC
Start: 2017-08-17 — End: 2017-08-17
  Administered 2017-08-17: 18:00:00 via ORAL

## 2017-08-17 MED ORDER — HYDROCODONE-ACETAMINOPHEN 5 MG-325 MG TAB
5-325 mg | ORAL_TABLET | ORAL | 0 refills | Status: AC | PRN
Start: 2017-08-17 — End: 2017-08-20

## 2017-08-17 MED FILL — HYDROCODONE-ACETAMINOPHEN 5 MG-325 MG TAB: 5-325 mg | ORAL | Qty: 1

## 2017-08-17 MED FILL — ONDANSETRON 4 MG TAB, RAPID DISSOLVE: 4 mg | ORAL | Qty: 1

## 2017-08-17 MED FILL — ACETAMINOPHEN 325 MG TABLET: 325 mg | ORAL | Qty: 2

## 2017-08-17 NOTE — ED Notes (Signed)
Family and pt updated , waiting on call from ER [facility] because Ortho is in surgery

## 2017-08-17 NOTE — ED Notes (Signed)
Pain control requested by PT, Dr. Fannon notified

## 2017-08-17 NOTE — ED Notes (Signed)
Left UE pulses remain intact, skin warm and dry and good sensation throughout

## 2017-08-17 NOTE — ED Provider Notes (Signed)
The history is provided by the patient.   Fall   The accident occurred 1 to 2 hours ago. The fall occurred while standing. She fell from a height of ground level. She landed on hard floor. There was no blood loss. The point of impact was the head, left elbow and right knee. The pain is present in the head, left elbow and right knee. The pain is severe. She was ambulatory at the scene. There was no entrapment after the fall. There was no drug use involved in the accident. There was no alcohol use involved in the accident. Pertinent negatives include no visual change, no fever, no numbness, no abdominal pain, no nausea, no vomiting, no headaches, no extremity weakness, no loss of consciousness, no tingling and no laceration. The risk factors include being elderly.  The symptoms are aggravated by use of injured limb. She has tried nothing for the symptoms. The treatment provided no relief.        Patient reports she tripped on a step at home, fell forward hit her face, denies loss of consciousness.  There is no syncope prior to the event.  She reports she hit her chest/face complains of left rib pain, left elbow pain, right knee pain, pain and bruising of her nose and abrasions of her face.  Has any abdominal pain.  No nausea vomiting.    Past Medical History:   Diagnosis Date   ??? Adverse effect of anesthesia     takes longer to wake up per pt   ??? Fracture     L knee, no surgery   ??? GERD (gastroesophageal reflux disease)    ??? Hypertension    ??? Ill-defined condition     increased cholesterol   ??? Ill-defined condition     osteoporosis/now osteopenia       Past Surgical History:   Procedure Laterality Date   ??? HX HYSTERECTOMY     ??? HX ORTHOPAEDIC      sugery for fx right wrist - has titanium   ??? HX ORTHOPAEDIC      L shoulder         Family History:   Problem Relation Age of Onset   ??? Cancer Mother         colon   ??? Cancer Sister         melanoma   ??? Cancer Brother         melanoma   ??? Heart Attack Brother     ??? Pacemaker Brother        Social History     Socioeconomic History   ??? Marital status: WIDOWED     Spouse name: Not on file   ??? Number of children: Not on file   ??? Years of education: Not on file   ??? Highest education level: Not on file   Occupational History   ??? Not on file   Social Needs   ??? Financial resource strain: Not on file   ??? Food insecurity:     Worry: Not on file     Inability: Not on file   ??? Transportation needs:     Medical: Not on file     Non-medical: Not on file   Tobacco Use   ??? Smoking status: Never Smoker   ??? Smokeless tobacco: Never Used   Substance and Sexual Activity   ??? Alcohol use: Yes     Alcohol/week: 2.4 oz     Types: 4 Glasses of wine per  week   ??? Drug use: Not on file   ??? Sexual activity: Not on file   Lifestyle   ??? Physical activity:     Days per week: Not on file     Minutes per session: Not on file   ??? Stress: Not on file   Relationships   ??? Social connections:     Talks on phone: Not on file     Gets together: Not on file     Attends religious service: Not on file     Active member of club or organization: Not on file     Attends meetings of clubs or organizations: Not on file     Relationship status: Not on file   ??? Intimate partner violence:     Fear of current or ex partner: Not on file     Emotionally abused: Not on file     Physically abused: Not on file     Forced sexual activity: Not on file   Other Topics Concern   ??? Not on file   Social History Narrative   ??? Not on file         ALLERGIES: Patient has no known allergies.    Review of Systems   Constitutional: Negative.  Negative for chills and fever.   HENT: Negative.  Negative for congestion, nosebleeds and sore throat.         Nose pain/contusion   Eyes: Negative.  Negative for discharge and redness.   Respiratory: Negative.  Negative for cough and shortness of breath.    Cardiovascular: Positive for chest pain. Negative for palpitations.        Ribs left    Gastrointestinal: Negative.  Negative for abdominal pain, nausea and vomiting.   Endocrine: Negative.  Negative for polydipsia and polyuria.   Genitourinary: Negative.  Negative for dysuria, flank pain and frequency.   Musculoskeletal: Positive for neck pain. Negative for arthralgias, back pain and extremity weakness.        Knee/elbow pain   Skin: Positive for wound. Negative for rash.   Allergic/Immunologic: Negative.    Neurological: Negative.  Negative for dizziness, tingling, loss of consciousness, weakness, numbness and headaches.   Hematological: Negative.  Negative for adenopathy. Does not bruise/bleed easily.   Psychiatric/Behavioral: Negative.  Negative for confusion. The patient is not nervous/anxious.    All other systems reviewed and are negative.      Vitals:    08/17/17 1206 08/17/17 1221 08/17/17 1338   BP: (!) 181/99 167/85 175/74   Pulse: 98  81   Resp: 18  14   Temp: 98.6 ??F (37 ??C)     SpO2: 100%     Weight: 63.5 kg (140 lb)     Height: 5' (1.524 Karon Heckendorn)              Physical Exam   Constitutional: She is oriented to person, place, and time. She appears well-developed and well-nourished.   HENT:   Head: Normocephalic.   Nasal bridge contusion/swelling, abrasions nose   Eyes: Pupils are equal, round, and reactive to light. Conjunctivae and EOM are normal.   Neck: Normal range of motion. Neck supple.   Cardiovascular: Normal rate, regular rhythm and intact distal pulses.   Pulmonary/Chest: Effort normal. No respiratory distress. She exhibits tenderness.   Tender left lower ribs   Abdominal: Soft. She exhibits no distension.   Musculoskeletal: She exhibits edema and tenderness.        Left elbow: She exhibits  decreased range of motion and swelling. She exhibits no laceration. Tenderness found.        Right knee: She exhibits decreased range of motion, swelling and ecchymosis. She exhibits no effusion. Tenderness found.   Neurological: She is alert and oriented to person, place, and time. She  has normal strength. No cranial nerve deficit or sensory deficit. She exhibits normal muscle tone.   Skin: Skin is warm and dry. Capillary refill takes less than 2 seconds. No laceration noted.   Psychiatric: She has a normal mood and affect. Her behavior is normal.   Nursing note and vitals reviewed.       MDM  Number of Diagnoses or Management Options     Amount and/or Complexity of Data Reviewed  Tests in the radiology section of CPT??: ordered and reviewed  Independent visualization of images, tracings, or specimens: yes    Risk of Complications, Morbidity, and/or Mortality  Presenting problems: high  Diagnostic procedures: high  Management options: high    Patient Progress  Patient progress: stable         Procedures      1:27 PM  Spoke with Dr Carin PrimroseMcCoig, pt needs hand surgery follow up for injury.  Barnie DelM Shawn Kymoni Lesperance, MD      2:45 PM    Discussed patient with Dr. Garner Nashaniels from [facility] [place], patient can be slinged and follow-up in the office, he will have his office contact her.  Results reviewed with family and follow-up plan discussed.    Pt has been re-examined and states that they are feeling better and have no new complaints.?? Laboratory tests, medications, x-rays, diagnosis, follow up plan and return instructions have been reviewed and discussed with the patient and/or family. Pt and/or family were instructed on symptoms that may arise after discharge requiring re-evaluation by a physician. Pt and/or family have had the opportunity to ask questions about their care. Patient and/or family verbalized understanding and agreement with care plan, follow up and return instructions. Patient and/or family agree to return in 48 hours if their symptoms are not improving or immediately if they have any change in their condition.     I have also put together some discharge instructions for him that include: 1) educational information regarding their diagnosis, 2) how to care for  their diagnosis at home, as well a 3) list of reasons why they would want to return to the ED prior to their follow-up appointment, should their condition change.       Barnie DelM Shawn Kylia Grajales, MD    MEDICATIONS GIVEN:    Current Facility-Administered Medications   Medication Dose Route Frequency   ??? acetaminophen (TYLENOL) tablet 650 mg  650 mg Oral Q6H PRN     Current Outpatient Medications   Medication Sig   ??? HYDROcodone-acetaminophen (NORCO) 5-325 mg per tablet Take 1 Tab by mouth every four (4) hours as needed for Pain for up to 3 days. Max Daily Amount: 6 Tabs.   ??? Desvenlafaxine 100 mg Tb24 Take 100 mg by mouth daily.   ??? Hydrochlorothiazide (HYDRODIURIL) 12.5 mg tablet Take 12.5 mg by mouth daily.   ??? fludrocortisone (FLORINEF) 0.1 mg tablet Take 0.1 mg by mouth daily.   ??? atorvastatin (LIPITOR) 20 mg tablet Take 20 mg by mouth daily.   ??? alendronate (FOSAMAX) 70 mg tablet Take 70 mg by mouth every Sunday.   ??? MULTIVITAMIN PO Take  by mouth. Takes one po daily.   ??? cyanocobalamin (VITAMIN B12) 100 mcg tablet Take  100 mcg by mouth daily.   ??? cholecalciferol, vitamin D3, 2,000 unit tab Take 2,000 Units by mouth daily.   ??? ASPIRIN PO Take 81 mg by mouth daily.   ??? omeprazole (PRILOSEC OTC) 20 mg tablet Take 20 mg by mouth daily.       LAB RESULTS:    No results found for this or any previous visit (from the past 12 hour(s)).    RADIOLOGY RESULTS:      XR KNEE RT MIN 4 V   Final Result   IMPRESSION: Suprapatellar soft tissue swelling. No acute fracture.      XR ELBOW LT MIN 3 V   Final Result   IMPRESSION: Olecranon fracture and likely fractures of both epicondyles.      XR RIBS LT W PA CXR MIN 3 V   Final Result   IMPRESSION: Nondisplaced left lateral sixth rib fracture.         CT HEAD WO CONT   Final Result   IMPRESSION: No acute intracranial hemorrhage, mass or infarct.           CT SPINE CERV WO CONT   Final Result   IMPRESSION:   Spondylitic changes without acute abnormality.      CT MAXILLOFACIAL WO CONT    Final Result   IMPRESSION: Nondisplaced bilateral nasal bone fractures.          IMPRESSION:    1. Left elbow fracture, closed, initial encounter    2. Closed fracture of nasal bone, initial encounter    3. Injury of head, initial encounter    4. Abrasion of face, initial encounter    5. Contusion of right knee, initial encounter    6. Closed fracture of one rib of left side, initial encounter    7. Fall, initial encounter        PLAN:    1. Norco/splint/sling  2. Ortho f/u

## 2017-08-17 NOTE — ED Notes (Signed)
Pt nausea  following splint application , medicated per orders and nausea subsided. Splint applied by Dr. Fannon , good pulses and cap refill at discharge.   Written and verbal discharge instructions reviewed with patient. All discharge medications reviewed and explained. Understanding verbalized, all questions answered. To car in WC , released in the care of Paul the neighbor

## 2017-08-17 NOTE — ED Triage Notes (Signed)
Mechanical fall ~ 1ft from doorway onto carpeted floor at 1000. Possible LOC. Reports some CP with deep inspiration. Left elbow pain with moderate amount of swelling noted + 2 radial pulse. Right knee pain with minimal amount of swelling - + ambulatory. Abrasion noted to nose. Denies neck or back pain.

## 2017-08-17 NOTE — ED Notes (Signed)
Rings removed form left hand , placed in plastic bag and into middle zipper pocket of pts purse per pt

## 2017-08-17 NOTE — ED Notes (Signed)
Left UE pulses remain intact, skin warm and dry and good sensation throughout

## 2017-08-17 NOTE — ED Notes (Signed)
Rings removed form left hand , placed in plastic bag and into middle zipper pocket of pts purse per pt

## 2017-08-17 NOTE — ED Notes (Signed)
Pain control requested by PT, Dr. Garry HeaterFannon notified

## 2017-08-17 NOTE — ED Notes (Signed)
Mechanical fall ~ 551ft from doorway onto carpeted floor at 1000. Possible LOC. Reports some CP with deep inspiration. Left elbow pain with moderate amount of swelling noted + 2 radial pulse. Right knee pain with minimal amount of swelling - + ambulatory. Abrasion noted to nose. Denies neck or back pain.

## 2017-08-17 NOTE — ED Provider Notes (Signed)
The history is provided by the patient.   Fall   The accident occurred 1 to 2 hours ago. The fall occurred while standing. She fell from a height of ground level. She landed on hard floor. There was no blood loss. The point of impact was the head, left elbow and right knee. The pain is present in the head, left elbow and right knee. The pain is severe. She was ambulatory at the scene. There was no entrapment after the fall. There was no drug use involved in the accident. There was no alcohol use involved in the accident. Pertinent negatives include no visual change, no fever, no numbness, no abdominal pain, no nausea, no vomiting, no headaches, no extremity weakness, no loss of consciousness, no tingling and no laceration. The risk factors include being elderly.  The symptoms are aggravated by use of injured limb. She has tried nothing for the symptoms. The treatment provided no relief.        Patient reports she tripped on a step at home, fell forward hit her face, denies loss of consciousness.  There is no syncope prior to the event.  She reports she hit her chest/face complains of left rib pain, left elbow pain, right knee pain, pain and bruising of her nose and abrasions of her face.  Has any abdominal pain.  No nausea vomiting.    Past Medical History:   Diagnosis Date   ??? Adverse effect of anesthesia     takes longer to wake up per pt   ??? Fracture     L knee, no surgery   ??? GERD (gastroesophageal reflux disease)    ??? Hypertension    ??? Ill-defined condition     increased cholesterol   ??? Ill-defined condition     osteoporosis/now osteopenia       Past Surgical History:   Procedure Laterality Date   ??? HX HYSTERECTOMY     ??? HX ORTHOPAEDIC      sugery for fx right wrist - has titanium   ??? HX ORTHOPAEDIC      L shoulder         Family History:   Problem Relation Age of Onset   ??? Cancer Mother         colon   ??? Cancer Sister         melanoma   ??? Cancer Brother         melanoma   ??? Heart Attack Brother    ??? Pacemaker  Brother        Social History     Socioeconomic History   ??? Marital status: WIDOWED     Spouse name: Not on file   ??? Number of children: Not on file   ??? Years of education: Not on file   ??? Highest education level: Not on file   Occupational History   ??? Not on file   Social Needs   ??? Financial resource strain: Not on file   ??? Food insecurity:     Worry: Not on file     Inability: Not on file   ??? Transportation needs:     Medical: Not on file     Non-medical: Not on file   Tobacco Use   ??? Smoking status: Never Smoker   ??? Smokeless tobacco: Never Used   Substance and Sexual Activity   ??? Alcohol use: Yes     Alcohol/week: 2.4 oz     Types: 4 Glasses of wine per  week   ??? Drug use: Not on file   ??? Sexual activity: Not on file   Lifestyle   ??? Physical activity:     Days per week: Not on file     Minutes per session: Not on file   ??? Stress: Not on file   Relationships   ??? Social connections:     Talks on phone: Not on file     Gets together: Not on file     Attends religious service: Not on file     Active member of club or organization: Not on file     Attends meetings of clubs or organizations: Not on file     Relationship status: Not on file   ??? Intimate partner violence:     Fear of current or ex partner: Not on file     Emotionally abused: Not on file     Physically abused: Not on file     Forced sexual activity: Not on file   Other Topics Concern   ??? Not on file   Social History Narrative   ??? Not on file         ALLERGIES: Patient has no known allergies.    Review of Systems   Constitutional: Negative.  Negative for chills and fever.   HENT: Negative.  Negative for congestion, nosebleeds and sore throat.         Nose pain/contusion   Eyes: Negative.  Negative for discharge and redness.   Respiratory: Negative.  Negative for cough and shortness of breath.    Cardiovascular: Positive for chest pain. Negative for palpitations.        Ribs left   Gastrointestinal: Negative.  Negative for abdominal pain, nausea and  vomiting.   Endocrine: Negative.  Negative for polydipsia and polyuria.   Genitourinary: Negative.  Negative for dysuria, flank pain and frequency.   Musculoskeletal: Positive for neck pain. Negative for arthralgias, back pain and extremity weakness.        Knee/elbow pain   Skin: Positive for wound. Negative for rash.   Allergic/Immunologic: Negative.    Neurological: Negative.  Negative for dizziness, tingling, loss of consciousness, weakness, numbness and headaches.   Hematological: Negative.  Negative for adenopathy. Does not bruise/bleed easily.   Psychiatric/Behavioral: Negative.  Negative for confusion. The patient is not nervous/anxious.    All other systems reviewed and are negative.      Vitals:    08/17/17 1206 08/17/17 1221 08/17/17 1338   BP: (!) 181/99 167/85 175/74   Pulse: 98  81   Resp: 18  14   Temp: 98.6 ??F (37 ??C)     SpO2: 100%     Weight: 63.5 kg (140 lb)     Height: 5' (1.524 Brandy White)              Physical Exam   Constitutional: She is oriented to person, place, and time. She appears well-developed and well-nourished.   HENT:   Head: Normocephalic.   Nasal bridge contusion/swelling, abrasions nose   Eyes: Pupils are equal, round, and reactive to light. Conjunctivae and EOM are normal.   Neck: Normal range of motion. Neck supple.   Cardiovascular: Normal rate, regular rhythm and intact distal pulses.   Pulmonary/Chest: Effort normal. No respiratory distress. She exhibits tenderness.   Tender left lower ribs   Abdominal: Soft. She exhibits no distension.   Musculoskeletal: She exhibits edema and tenderness.        Left elbow: She exhibits  decreased range of motion and swelling. She exhibits no laceration. Tenderness found.        Right knee: She exhibits decreased range of motion, swelling and ecchymosis. She exhibits no effusion. Tenderness found.   Neurological: She is alert and oriented to person, place, and time. She has normal strength. No cranial nerve deficit or sensory deficit. She exhibits  normal muscle tone.   Skin: Skin is warm and dry. Capillary refill takes less than 2 seconds. No laceration noted.   Psychiatric: She has a normal mood and affect. Her behavior is normal.   Nursing note and vitals reviewed.       MDM  Number of Diagnoses or Management Options     Amount and/or Complexity of Data Reviewed  Tests in the radiology section of CPT??: ordered and reviewed  Independent visualization of images, tracings, or specimens: yes    Risk of Complications, Morbidity, and/or Mortality  Presenting problems: high  Diagnostic procedures: high  Management options: high    Patient Progress  Patient progress: stable         Procedures      1:27 PM  Spoke with Dr Carin PrimroseMcCoig, pt needs hand surgery follow up for injury.  Barnie DelM Shawn Detric Scalisi, MD      2:45 PM    Discussed patient with Dr. Garner Nashaniels from [facility], patient can be slinged and follow-up in the office, he will have his office contact her.  Results reviewed with family and follow-up plan discussed.    Pt has been re-examined and states that they are feeling better and have no new complaints.?? Laboratory tests, medications, x-rays, diagnosis, follow up plan and return instructions have been reviewed and discussed with the patient and/or family. Pt and/or family were instructed on symptoms that may arise after discharge requiring re-evaluation by a physician. Pt and/or family have had the opportunity to ask questions about their care. Patient and/or family verbalized understanding and agreement with care plan, follow up and return instructions. Patient and/or family agree to return in 48 hours if their symptoms are not improving or immediately if they have any change in their condition.     I have also put together some discharge instructions for him that include: 1) educational information regarding their diagnosis, 2) how to care for their diagnosis at home, as well a 3) list of reasons why they would want to return to the ED prior to their follow-up  appointment, should their condition change.       Barnie DelM Shawn Sydelle Sherfield, MD    MEDICATIONS GIVEN:    Current Facility-Administered Medications   Medication Dose Route Frequency   ??? acetaminophen (TYLENOL) tablet 650 mg  650 mg Oral Q6H PRN     Current Outpatient Medications   Medication Sig   ??? HYDROcodone-acetaminophen (NORCO) 5-325 mg per tablet Take 1 Tab by mouth every four (4) hours as needed for Pain for up to 3 days. Max Daily Amount: 6 Tabs.   ??? Desvenlafaxine 100 mg Tb24 Take 100 mg by mouth daily.   ??? Hydrochlorothiazide (HYDRODIURIL) 12.5 mg tablet Take 12.5 mg by mouth daily.   ??? fludrocortisone (FLORINEF) 0.1 mg tablet Take 0.1 mg by mouth daily.   ??? atorvastatin (LIPITOR) 20 mg tablet Take 20 mg by mouth daily.   ??? alendronate (FOSAMAX) 70 mg tablet Take 70 mg by mouth every Sunday.   ??? MULTIVITAMIN PO Take  by mouth. Takes one po daily.   ??? cyanocobalamin (VITAMIN B12) 100 mcg tablet Take  100 mcg by mouth daily.   ??? cholecalciferol, vitamin D3, 2,000 unit tab Take 2,000 Units by mouth daily.   ??? ASPIRIN PO Take 81 mg by mouth daily.   ??? omeprazole (PRILOSEC OTC) 20 mg tablet Take 20 mg by mouth daily.       LAB RESULTS:    No results found for this or any previous visit (from the past 12 hour(s)).    RADIOLOGY RESULTS:      XR KNEE RT MIN 4 V   Final Result   IMPRESSION: Suprapatellar soft tissue swelling. No acute fracture.      XR ELBOW LT MIN 3 V   Final Result   IMPRESSION: Olecranon fracture and likely fractures of both epicondyles.      XR RIBS LT W PA CXR MIN 3 V   Final Result   IMPRESSION: Nondisplaced left lateral sixth rib fracture.         CT HEAD WO CONT   Final Result   IMPRESSION: No acute intracranial hemorrhage, mass or infarct.           CT SPINE CERV WO CONT   Final Result   IMPRESSION:   Spondylitic changes without acute abnormality.      CT MAXILLOFACIAL WO CONT   Final Result   IMPRESSION: Nondisplaced bilateral nasal bone fractures.          IMPRESSION:    1. Left elbow fracture,  closed, initial encounter    2. Closed fracture of nasal bone, initial encounter    3. Injury of head, initial encounter    4. Abrasion of face, initial encounter    5. Contusion of right knee, initial encounter    6. Closed fracture of one rib of left side, initial encounter    7. Fall, initial encounter        PLAN:    1. Norco/splint/sling  2. Ortho f/u

## 2017-08-17 NOTE — ED Notes (Signed)
Family and pt updated , waiting on call from ER [facility] because Ortho is in surgery

## 2017-08-17 NOTE — ED Notes (Signed)
Pt nausea  following splint application , medicated per orders and nausea subsided. Splint applied by Dr. Garry HeaterFannon , good pulses and cap refill at discharge.   Written and verbal discharge instructions reviewed with patient. All discharge medications reviewed and explained. Understanding verbalized, all questions answered. [facility] released in the care of Renae Fickleaul the neighbor

## 2017-08-18 ENCOUNTER — Inpatient Hospital Stay: Admit: 2017-08-18 | Payer: MEDICARE | Attending: Hand Surgery | Primary: Family Medicine

## 2017-08-18 DIAGNOSIS — S42492A Other displaced fracture of lower end of left humerus, initial encounter for closed fracture: Secondary | ICD-10-CM

## 2017-08-21 ENCOUNTER — Inpatient Hospital Stay: Payer: MEDICARE | Attending: Hand Surgery | Primary: Family Medicine

## 2018-08-27 ENCOUNTER — Inpatient Hospital Stay
Admit: 2018-08-27 | Discharge: 2018-08-27 | Disposition: A | Discharge status: Home / Self Care | Payer: MEDICARE | Attending: Emergency Medicine

## 2018-08-27 ENCOUNTER — Emergency Department: Admit: 2018-08-27 | Payer: MEDICARE | Primary: Family Medicine

## 2018-08-27 DIAGNOSIS — R413 Other amnesia: Secondary | ICD-10-CM

## 2018-08-27 LAB — CBC WITH AUTO DIFFERENTIAL
Basophils %: 0 % (ref 0–1)
Basophils Absolute: 0 10*3/uL (ref 0.0–0.1)
Eosinophils %: 0 % (ref 0–7)
Eosinophils Absolute: 0 10*3/uL (ref 0.0–0.4)
Granulocyte Absolute Count: 0 10*3/uL (ref 0.00–0.04)
Hematocrit: 40.3 % (ref 35.0–47.0)
Hemoglobin: 13.7 g/dL (ref 11.5–16.0)
Immature Granulocytes: 0 % (ref 0.0–0.5)
Lymphocytes %: 7 % — ABNORMAL LOW (ref 12–49)
Lymphocytes Absolute: 0.6 10*3/uL — ABNORMAL LOW (ref 0.8–3.5)
MCH: 31.5 PG (ref 26.0–34.0)
MCHC: 34 g/dL (ref 30.0–36.5)
MCV: 92.6 FL (ref 80.0–99.0)
MPV: 10.1 FL (ref 8.9–12.9)
Monocytes %: 5 % (ref 5–13)
Monocytes Absolute: 0.4 10*3/uL (ref 0.0–1.0)
NRBC Absolute: 0 10*3/uL (ref 0.00–0.01)
Neutrophils %: 88 % — ABNORMAL HIGH (ref 32–75)
Neutrophils Absolute: 7.5 10*3/uL (ref 1.8–8.0)
Nucleated RBCs: 0 PER 100 WBC
Platelets: 321 10*3/uL (ref 150–400)
RBC: 4.35 M/uL (ref 3.80–5.20)
RDW: 12.4 % (ref 11.5–14.5)
WBC: 8.6 10*3/uL (ref 3.6–11.0)

## 2018-08-27 LAB — URINALYSIS W/ RFLX MICROSCOPIC
Bilirubin, Urine: NEGATIVE
Bilirubin: NEGATIVE
Glucose, Ur: NEGATIVE mg/dL
Glucose: NEGATIVE mg/dL
Leukocyte Esterase, Urine: NEGATIVE
Leukocyte Esterase: NEGATIVE
Nitrite, Urine: NEGATIVE
Nitrites: NEGATIVE
Protein, UA: NEGATIVE mg/dL
Protein: NEGATIVE mg/dL
Specific Gravity, UA: 1.015 (ref 1.003–1.030)
Specific gravity: 1.015 (ref 1.003–1.030)
Urobilinogen, UA, POCT: 0.2 EU/dL (ref 0.2–1.0)
Urobilinogen: 0.2 EU/dL (ref 0.2–1.0)
pH (UA): 6 (ref 5.0–8.0)
pH, UA: 6 (ref 5.0–8.0)

## 2018-08-27 LAB — COMPREHENSIVE METABOLIC PANEL
ALT: 25 U/L (ref 12–78)
AST: 22 U/L (ref 15–37)
Albumin/Globulin Ratio: 1.3 (ref 1.1–2.2)
Albumin: 4 g/dL (ref 3.5–5.0)
Alkaline Phosphatase: 78 U/L (ref 45–117)
Anion Gap: 12 mmol/L (ref 5–15)
BUN: 15 MG/DL (ref 6–20)
Bun/Cre Ratio: 17 (ref 12–20)
CO2: 25 mmol/L (ref 21–32)
Calcium: 9.1 MG/DL (ref 8.5–10.1)
Chloride: 97 mmol/L (ref 97–108)
Creatinine: 0.88 MG/DL (ref 0.55–1.02)
EGFR IF NonAfrican American: >60 mL/min/{1.73_m2} (ref >60)
GFR African American: >60 mL/min/{1.73_m2} (ref >60)
Globulin: 3.2 g/dL (ref 2.0–4.0)
Glucose: 115 mg/dL — ABNORMAL HIGH (ref 65–100)
Potassium: 3.7 mmol/L (ref 3.5–5.1)
Sodium: 134 mmol/L — ABNORMAL LOW (ref 136–145)
Total Bilirubin: 0.6 MG/DL (ref 0.2–1.0)
Total Protein: 7.2 g/dL (ref 6.4–8.2)

## 2018-08-27 LAB — URINE MICROSCOPIC ONLY
BACTERIA, URINE: NEGATIVE /hpf
Bacteria: NEGATIVE /hpf

## 2018-08-27 LAB — TROPONIN: Troponin I: <0.05 ng/mL (ref <0.05)

## 2018-08-27 LAB — METABOLIC PANEL, COMPREHENSIVE
A-G Ratio: 1.3 (ref 1.1–2.2)
ALT (SGPT): 25 U/L (ref 12–78)
AST (SGOT): 22 U/L (ref 15–37)
Albumin: 4 g/dL (ref 3.5–5.0)
Alk. phosphatase: 78 U/L (ref 45–117)
Anion gap: 12 mmol/L (ref 5–15)
BUN/Creatinine ratio: 17 (ref 12–20)
BUN: 15 MG/DL (ref 6–20)
Bilirubin, total: 0.6 MG/DL (ref 0.2–1.0)
CO2: 25 mmol/L (ref 21–32)
Calcium: 9.1 MG/DL (ref 8.5–10.1)
Chloride: 97 mmol/L (ref 97–108)
Creatinine: 0.88 MG/DL (ref 0.55–1.02)
GFR est AA: >60 mL/min/{1.73_m2} (ref >60)
GFR est non-AA: >60 mL/min/{1.73_m2} (ref >60)
Globulin: 3.2 g/dL (ref 2.0–4.0)
Glucose: 115 mg/dL — ABNORMAL HIGH (ref 65–100)
Potassium: 3.7 mmol/L (ref 3.5–5.1)
Protein, total: 7.2 g/dL (ref 6.4–8.2)
Sodium: 134 mmol/L — ABNORMAL LOW (ref 136–145)

## 2018-08-27 LAB — CBC WITH AUTOMATED DIFF
ABS. BASOPHILS: 0 10*3/uL (ref 0.0–0.1)
ABS. EOSINOPHILS: 0 10*3/uL (ref 0.0–0.4)
ABS. IMM. GRANS.: 0 10*3/uL (ref 0.00–0.04)
ABS. LYMPHOCYTES: 0.6 10*3/uL — ABNORMAL LOW (ref 0.8–3.5)
ABS. MONOCYTES: 0.4 10*3/uL (ref 0.0–1.0)
ABS. NEUTROPHILS: 7.5 10*3/uL (ref 1.8–8.0)
ABSOLUTE NRBC: 0 10*3/uL (ref 0.00–0.01)
BASOPHILS: 0 % (ref 0–1)
EOSINOPHILS: 0 % (ref 0–7)
HCT: 40.3 % (ref 35.0–47.0)
HGB: 13.7 g/dL (ref 11.5–16.0)
IMMATURE GRANULOCYTES: 0 % (ref 0.0–0.5)
LYMPHOCYTES: 7 % — ABNORMAL LOW (ref 12–49)
MCH: 31.5 PG (ref 26.0–34.0)
MCHC: 34 g/dL (ref 30.0–36.5)
MCV: 92.6 FL (ref 80.0–99.0)
MONOCYTES: 5 % (ref 5–13)
MPV: 10.1 FL (ref 8.9–12.9)
NEUTROPHILS: 88 % — ABNORMAL HIGH (ref 32–75)
NRBC: 0 PER 100 WBC
PLATELET: 321 10*3/uL (ref 150–400)
RBC: 4.35 M/uL (ref 3.80–5.20)
RDW: 12.4 % (ref 11.5–14.5)
WBC: 8.6 10*3/uL (ref 3.6–11.0)

## 2018-08-27 LAB — TROPONIN I: Troponin-I, Qt.: <0.05 ng/mL (ref <0.05)

## 2018-08-27 MED ORDER — SODIUM CHLORIDE 0.9% BOLUS IV
0.9 % | Freq: Once | INTRAVENOUS | Status: AC
Start: 2018-08-27 — End: 2018-08-27
  Administered 2018-08-27: 18:00:00 via INTRAVENOUS

## 2018-08-27 MED FILL — SODIUM CHLORIDE 0.9 % IV: INTRAVENOUS | Qty: 500

## 2018-08-27 NOTE — ED Notes (Signed)
The patient was provided discharge instructions including information on follow-up care instructions. The patient was provided education on the circumstances in which they should return to the Emergency Department. All teaching was confirmed utilizing the teach back method. The patient was provided the opportunity to ask questions. The patient had all of their belongings at the time of discharge. The patient ambulated out of the Emergency Department in NAD.

## 2018-08-27 NOTE — ED Provider Notes (Signed)
EMERGENCY DEPARTMENT HISTORY AND PHYSICAL EXAM      Date: 08/27/2018  Patient Name: Brandy White    History of Presenting Illness     Chief Complaint   Patient presents with   ??? Memory Loss       History Provided By: Patient    HPI: Brandy White, 78 y.o. female with PMHx significant for GERD, hypertension, presents ambulatory to the ED with cc of memory loss.  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  They noticed that she was having some memory difficulties.  Reports that her granddaughter had been staying with her for the past week and she had forgotten that.  She denies any extremity weakness numbness or tingling.  No problems with speech.  No changes in vision.  She denies any chest pain or shortness of breath.  She denies any abdominal pain, nausea, vomiting or diarrhea.  She is otherwise healthy.  She ambulated into the treatment area without difficulty.  No recent medication changes.  She does drink an occasional glass of wine but not in excess.  No more than usual.  States she is otherwise thinking clearly.  She herself states she feels fine and was not aware that she was having any memory difficulties.  Her family wished to come to the ER to be evaluated.        PMHx: Significant for hypertension  PSHx: Significant for hysterectomy, left shoulder surgery  Social Hx: Non-smoker.  Drinks about 4 glasses of wine a week.  No drug use.    There are no other complaints, changes, or physical findings at this time.    PCP: Brandy Cambridge, MD    No current facility-administered medications on file prior to encounter.      Current Outpatient Medications on File Prior to Encounter   Medication Sig Dispense Refill   ??? ondansetron (ZOFRAN ODT) 4 mg disintegrating tablet Take 1 Tab by mouth every eight (8) hours as needed for Nausea. 10 Tab 0   ??? Desvenlafaxine 100 mg Tb24 Take 100 mg by mouth daily.      ??? Hydrochlorothiazide (HYDRODIURIL) 12.5 mg tablet Take 12.5 mg by mouth daily.     ??? fludrocortisone (FLORINEF) 0.1 mg tablet Take 0.1 mg by mouth daily.     ??? atorvastatin (LIPITOR) 20 mg tablet Take 20 mg by mouth daily.     ??? alendronate (FOSAMAX) 70 mg tablet Take 70 mg by mouth every Sunday.     ??? MULTIVITAMIN PO Take  by mouth. Takes one po daily.     ??? cyanocobalamin (VITAMIN B12) 100 mcg tablet Take 100 mcg by mouth daily.     ??? cholecalciferol, vitamin D3, 2,000 unit tab Take 2,000 Units by mouth daily.     ??? ASPIRIN PO Take 81 mg by mouth daily.     ??? omeprazole (PRILOSEC OTC) 20 mg tablet Take 20 mg by mouth daily.         Past History     Past Medical History:  Past Medical History:   Diagnosis Date   ??? Adverse effect of anesthesia     takes longer to wake up per pt   ??? Fracture     L knee, no surgery   ??? GERD (gastroesophageal reflux disease)    ??? Hypertension    ??? Ill-defined condition     increased cholesterol   ??? Ill-defined condition  osteoporosis/now osteopenia       Past Surgical History:  Past Surgical History:   Procedure Laterality Date   ??? HX HYSTERECTOMY     ??? HX ORTHOPAEDIC      sugery for fx right wrist - has titanium   ??? HX ORTHOPAEDIC      L shoulder       Family History:  Family History   Problem Relation Age of Onset   ??? Cancer Mother         colon   ??? Cancer Sister         melanoma   ??? Cancer Brother         melanoma   ??? Heart Attack Brother    ??? Pacemaker Brother        Social History:  Social History     Tobacco Use   ??? Smoking status: Never Smoker   ??? Smokeless tobacco: Never Used   Substance Use Topics   ??? Alcohol use: Yes     Alcohol/week: 4.0 standard drinks     Types: 4 Glasses of wine per week   ??? Drug use: Not on file       Allergies:  No Known Allergies      Review of Systems   Review of Systems   Constitutional: Negative for activity change, chills and fever.   HENT: Negative for congestion and sore throat.    Eyes: Negative for pain and redness.    Respiratory: Negative for cough, chest tightness and shortness of breath.    Cardiovascular: Negative for chest pain and palpitations.   Gastrointestinal: Negative for abdominal pain, diarrhea, nausea and vomiting.   Genitourinary: Negative for dysuria, frequency and urgency.   Musculoskeletal: Negative for back pain and neck pain.   Skin: Negative for rash.   Neurological: Negative for dizziness, tremors, seizures, syncope, facial asymmetry, speech difficulty, weakness, light-headedness, numbness and headaches.   Psychiatric/Behavioral: Negative for hallucinations and sleep disturbance.   All other systems reviewed and are negative.      Physical Exam   Physical Exam  Vitals signs and nursing note reviewed.   Constitutional:       General: She is not in acute distress.     Appearance: Normal appearance. She is well-developed. She is not diaphoretic.      Comments: Calm and pleasant elderly white female.   HENT:      Head: Normocephalic and atraumatic.      Right Ear: Tympanic membrane normal.      Left Ear: Tympanic membrane normal.      Nose: Nose normal.   Eyes:      General: No scleral icterus.     Extraocular Movements: Extraocular movements intact.      Conjunctiva/sclera: Conjunctivae normal.      Pupils: Pupils are equal, round, and reactive to light.      Comments: Conjunctiva clear bilaterally;    Neck:      Musculoskeletal: Normal range of motion and neck supple.      Thyroid: No thyromegaly.      Vascular: No JVD.      Trachea: No tracheal deviation.   Cardiovascular:      Rate and Rhythm: Normal rate and regular rhythm.      Pulses: Normal pulses.      Heart sounds: No murmur. No friction rub. No gallop.    Pulmonary:      Effort: Pulmonary effort is normal. No respiratory distress.  Breath sounds: Normal breath sounds. No stridor. No wheezing or rales.   Chest:      Chest wall: No tenderness.   Abdominal:      General: Bowel sounds are normal. There is no distension.       Palpations: Abdomen is soft.      Tenderness: There is no abdominal tenderness. There is no guarding or rebound.   Musculoskeletal: Normal range of motion.         General: No tenderness.   Skin:     General: Skin is warm and dry.      Capillary Refill: Capillary refill takes less than 2 seconds.      Findings: No rash.   Neurological:      General: No focal deficit present.      Mental Status: She is alert and oriented to person, place, and time.      Cranial Nerves: No cranial nerve deficit.      Sensory: No sensory deficit.      Motor: No weakness or abnormal muscle tone.      Coordination: Coordination normal.      Gait: Gait normal.      Deep Tendon Reflexes: Reflexes normal.      Comments: 5/5 strength throughout; no past pointing; good heel to shin; negative romberg; holds both arms extended in front of them for 10 seconds without difficulty or drift; lifts legs off of bed without difficulty; no pronator drift; good equal grip strength bilaterally; motor and sensory grossly intact; no facial asymmetry;      Psychiatric:         Mood and Affect: Mood normal.         Behavior: Behavior normal.             Diagnostic Study Results     Labs -     Recent Results (from the past 12 hour(s))   CBC WITH AUTOMATED DIFF    Collection Time: 08/27/18 11:51 AM   Result Value Ref Range    WBC 8.6 3.6 - 11.0 K/uL    RBC 4.35 3.80 - 5.20 M/uL    HGB 13.7 11.5 - 16.0 g/dL    HCT 40.3 35.0 - 47.0 %    MCV 92.6 80.0 - 99.0 FL    MCH 31.5 26.0 - 34.0 PG    MCHC 34.0 30.0 - 36.5 g/dL    RDW 12.4 11.5 - 14.5 %    PLATELET 321 150 - 400 K/uL    MPV 10.1 8.9 - 12.9 FL    NRBC 0.0 0 PER 100 WBC    ABSOLUTE NRBC 0.00 0.00 - 0.01 K/uL    NEUTROPHILS 88 (H) 32 - 75 %    LYMPHOCYTES 7 (L) 12 - 49 %    MONOCYTES 5 5 - 13 %    EOSINOPHILS 0 0 - 7 %    BASOPHILS 0 0 - 1 %    IMMATURE GRANULOCYTES 0 0.0 - 0.5 %    ABS. NEUTROPHILS 7.5 1.8 - 8.0 K/UL    ABS. LYMPHOCYTES 0.6 (L) 0.8 - 3.5 K/UL    ABS. MONOCYTES 0.4 0.0 - 1.0 K/UL     ABS. EOSINOPHILS 0.0 0.0 - 0.4 K/UL    ABS. BASOPHILS 0.0 0.0 - 0.1 K/UL    ABS. IMM. GRANS. 0.0 0.00 - 0.04 K/UL    DF AUTOMATED     METABOLIC PANEL, COMPREHENSIVE    Collection Time: 08/27/18 11:51 AM   Result Value Ref  Range    Sodium 134 (L) 136 - 145 mmol/L    Potassium 3.7 3.5 - 5.1 mmol/L    Chloride 97 97 - 108 mmol/L    CO2 25 21 - 32 mmol/L    Anion gap 12 5 - 15 mmol/L    Glucose 115 (H) 65 - 100 mg/dL    BUN 15 6 - 20 MG/DL    Creatinine 0.88 0.55 - 1.02 MG/DL    BUN/Creatinine ratio 17 12 - 20      GFR est AA >60 >60 ml/min/1.27m    GFR est non-AA >60 >60 ml/min/1.727m   Calcium 9.1 8.5 - 10.1 MG/DL    Bilirubin, total 0.6 0.2 - 1.0 MG/DL    ALT (SGPT) 25 12 - 78 U/L    AST (SGOT) 22 15 - 37 U/L    Alk. phosphatase 78 45 - 117 U/L    Protein, total 7.2 6.4 - 8.2 g/dL    Albumin 4.0 3.5 - 5.0 g/dL    Globulin 3.2 2.0 - 4.0 g/dL    A-G Ratio 1.3 1.1 - 2.2     TROPONIN I    Collection Time: 08/27/18 11:51 AM   Result Value Ref Range    Troponin-I, Qt. <0.05 <0.05 ng/mL   EKG, 12 LEAD, INITIAL    Collection Time: 08/27/18 11:58 AM   Result Value Ref Range    Ventricular Rate 82 BPM    Atrial Rate 82 BPM    P-R Interval 158 ms    QRS Duration 138 ms    Q-T Interval 384 ms    QTC Calculation (Bezet) 448 ms    Calculated P Axis 42 degrees    Calculated R Axis -10 degrees    Calculated T Axis 149 degrees    Diagnosis       Normal sinus rhythm  Left bundle branch block  Abnormal ECG  No previous ECGs available     URINALYSIS W/ RFLX MICROSCOPIC    Collection Time: 08/27/18  2:48 PM   Result Value Ref Range    Color YELLOW/STRAW      Appearance CLEAR CLEAR      Specific gravity 1.015 1.003 - 1.030      pH (UA) 6.0 5.0 - 8.0      Protein Negative NEG mg/dL    Glucose Negative NEG mg/dL    Ketone TRACE (A) NEG mg/dL    Bilirubin Negative NEG      Blood SMALL (A) NEG      Urobilinogen 0.2 0.2 - 1.0 EU/dL    Nitrites Negative NEG      Leukocyte Esterase Negative NEG     URINE MICROSCOPIC ONLY     Collection Time: 08/27/18  2:48 PM   Result Value Ref Range    WBC 5-10 0 - 4 /hpf    RBC 5-10 0 - 5 /hpf    Epithelial cells FEW FEW /lpf    Bacteria Negative NEG /hpf    Mucus 2+ /lpf    Amorphous Crystals 1+ (A) NEG       Radiologic Studies -   CT HEAD WO CONT   Final Result   IMPRESSION:    No acute intracranial abnormality. Probable mild chronic small vessel ischemic   disease.            XR CHEST SNGL V   Final Result   IMPRESSION: No acute cardiopulmonary process.        CT Results  (Last  48 hours)               08/27/18 1314  CT HEAD WO CONT Final result    Impression:  IMPRESSION:    No acute intracranial abnormality. Probable mild chronic small vessel ischemic   disease.               Narrative:  EXAM: CT HEAD WO CONT       INDICATION: memory loss       COMPARISON: None.       CONTRAST: None.       TECHNIQUE: Unenhanced CT of the head was performed using 5 mm images. Brain and   bone windows were generated. Coronal and sagittal reformats. CT dose reduction   was achieved through use of a standardized protocol tailored for this   examination and automatic exposure control for dose modulation.         FINDINGS:   The ventricles and sulci are normal in size, shape and configuration.. Mild   subcortical deep white matter hypodensities. There is no intracranial   hemorrhage, extra-axial collection, or mass effect. The basilar cisterns are   open. No CT evidence of acute infarct.       The bone windows demonstrate no abnormalities. The visualized portions of the   paranasal sinuses and mastoid air cells are clear.               CXR Results  (Last 48 hours)               08/27/18 1205  XR CHEST SNGL V Final result    Impression:  IMPRESSION: No acute cardiopulmonary process.       Narrative:  EXAM: XR CHEST SNGL V       INDICATION: Chest Pain       COMPARISON: 08/17/2017       FINDINGS: A portable AP radiograph of the chest was obtained at 1208 hours. The    patient is on a cardiac monitor. The lungs are clear. The cardiac and   mediastinal contours and pulmonary vascularity are normal.  The bones and soft   tissues are grossly within normal limits.                    Medical Decision Making   I am the first provider for this patient.    I reviewed the vital signs, available nursing notes, past medical history, past surgical history, family history and social history.    Vital Signs-Reviewed the patient's vital signs.  Patient Vitals for the past 12 hrs:   Pulse Resp BP SpO2   08/27/18 1401 ??? ??? 134/54 ???   08/27/18 1354 79 19 131/72 99 %   08/27/18 1201 ??? ??? 132/75 97 %   08/27/18 1145 ??? ??? 140/52 97 %       Pulse Oximetry Analysis - 97% on RA    Cardiac Monitor:   Rate: 82 bpm  Rhythm: Normal Sinus Rhythm        ED EKG interpretation:11:58  Rhythm: normal sinus rhythm; and regular . Rate (approx.): 82; Axis: normal; PR Interval: normal; QRS interval: Bundle branch block; ST/T wave: non-specific changes; This EKG was interpreted by Allen Derry MD,ED Provider.      Records Reviewed: Nursing Notes and Old Medical Records    Provider Notes (Medical Decision Making):   DDX: Dementia, CVA, metabolic abnormality, delirium.    ED Course:   Initial assessment performed. The patients presenting problems have  been discussed, and they are in agreement with the care plan formulated and outlined with them.  I have encouraged them to ask questions as they arise throughout their visit.           PROGRESS NOTE    Pt reevaluated.  CT head without acute findings.  Only chronic microvascular ischemic changes.  CBC and CMP grossly unremarkable UA negative no bacteria.  Chest x-ray clear.  No neuro deficits.  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  Patient did have some ketones in her urine suggesting dehydration.  Advised her to follow-up with her PCP.  Patient already on daily aspirin.  Written by Allen Derry, MD     Progress note:     Pt noted to be feeling better , ready for discharge. Updated pt and/or family on all final lab and imaging findings.    Will follow up as instructed. All questions have been answered, pt voiced understanding and agreement with plan.         Specific return precautions provided as well as instructions to return to the ED should sx worsen at any time. Vital signs stable for discharge.       I have also put together some discharge instructions for him that include: 1) educational information regarding their diagnosis, 2) how to care for their diagnosis at home, as well a 3) list of reasons why they would want to return to the ED prior to their follow-up appointment, should their condition change.  Written by Allen Derry, MD        Critical Care Time:   0    Disposition:  Discharge    PLAN:  1.   Current Discharge Medication List        2.   Follow-up Information     Follow up With Specialties Details Why Contact Info    Brandy Cambridge, MD Family Practice Schedule an appointment as soon as possible for a visit in 3 days  [address]  [address]  [place] [address]  [phone]      [facility] Go in 1 day If symptoms worsen [place]  [phone]        Return to ED if worse     Diagnosis     Clinical Impression:   1. Memory loss or impairment    2. Dehydration              Please note that this dictation was completed with Dragon, the computer voice recognition software.  Quite often unanticipated grammatical, syntax, homophones, and other interpretive errors are inadvertently transcribed by the computer software.  Please disregard these errors.  Please excuse any errors that have escaped final proofreading.

## 2018-08-27 NOTE — ED Notes (Signed)
Treatment plan reviewed with patient.

## 2018-08-27 NOTE — ED Notes (Signed)
Pt returned from CT without incident.

## 2018-08-27 NOTE — ED Notes (Signed)
Pt is resting in bed in NAD. VSS. Denies any needs at this time. Will continue to monitor.

## 2018-08-27 NOTE — ED Notes (Signed)
Pt states she is unable to provide urine specimen at this time. Pt to alert ED staff if she feels she is able to urinate.

## 2018-08-27 NOTE — ED Notes (Signed)
Phlebotomy at bedside obtaining labs.

## 2018-08-27 NOTE — ED Notes (Signed)
Pt to CT in NAD

## 2018-08-27 NOTE — ED Notes (Signed)
Xray at bedside.

## 2018-08-27 NOTE — ED Notes (Signed)
Pt states she is unable to provide urine specimen at this time. Pt to alert ED staff if she feels she is able to urinate.

## 2018-08-27 NOTE — ED Notes (Signed)
 Treatment plan reviewed with patient.

## 2018-08-27 NOTE — ED Notes (Signed)
Pt is resting in bed in NAD. VSS. Denies any needs at this time. Will continue to monitor.

## 2018-08-27 NOTE — ED Provider Notes (Signed)
ED Provider Notes by Allen Derry, MD at 08/27/18 1136                Author: Allen Derry, MD  Service: Emergency Medicine  Author Type: Physician       Filed: 08/27/18 1511  Date of Service: 08/27/18 1136  Status: Signed          Editor: Allen Derry, MD (Physician)               EMERGENCY DEPARTMENT HISTORY AND PHYSICAL EXAM           Date: 08/27/2018   Patient Name: Brandy White        History of Presenting Illness          Chief Complaint       Patient presents with        ?  Memory Loss           History Provided By: Patient      HPI: Brandy White,  78 y.o. female with PMHx significant for GERD, hypertension, presents ambulatory to the  ED with cc of memory loss.  Patient reports she has a [place] in [place] and her family came to visit her for the holiday today.  They noticed that she was having some memory difficulties.  Reports that her granddaughter had been staying with her for  the past week and she had forgotten that.  She denies any extremity weakness numbness or tingling.  No problems with speech.  No changes in vision.  She denies any chest pain or shortness of breath.  She denies any abdominal pain, nausea, vomiting or  diarrhea.  She is otherwise healthy.  She ambulated into the treatment area without difficulty.  No recent medication changes.  She does drink an occasional glass of wine but not in excess.  No more than usual.  States she is otherwise thinking clearly.   She herself states she feels fine and was not aware that she was having any memory difficulties.  Her family wished to come to the ER to be evaluated.            PMHx: Significant for hypertension   PSHx: Significant for hysterectomy, left shoulder surgery   Social Hx: Non-smoker.  Drinks about 4 glasses of wine a week.  No drug use.      There are no other complaints, changes, or physical findings at this time.      PCP: Brandy Cambridge, MD        No current facility-administered medications on file prior to  encounter.           Current Outpatient Medications on File Prior to Encounter          Medication  Sig  Dispense  Refill           ?  ondansetron (ZOFRAN ODT) 4 mg disintegrating tablet  Take 1 Tab by mouth every eight (8) hours as needed for Nausea.  10 Tab  0     ?  Desvenlafaxine 100 mg Tb24  Take 100 mg by mouth daily.               ?  Hydrochlorothiazide (HYDRODIURIL) 12.5 mg tablet  Take 12.5 mg by mouth daily.               ?  fludrocortisone (FLORINEF) 0.1 mg tablet  Take 0.1 mg by mouth daily.         ?  atorvastatin (LIPITOR) 20 mg tablet  Take 20 mg by mouth daily.         ?  alendronate (FOSAMAX) 70 mg tablet  Take 70 mg by mouth every Sunday.         ?  MULTIVITAMIN PO  Take  by mouth. Takes one po daily.         ?  cyanocobalamin (VITAMIN B12) 100 mcg tablet  Take 100 mcg by mouth daily.         ?  cholecalciferol, vitamin D3, 2,000 unit tab  Take 2,000 Units by mouth daily.         ?  ASPIRIN PO  Take 81 mg by mouth daily.               ?  omeprazole (PRILOSEC OTC) 20 mg tablet  Take 20 mg by mouth daily.                 Past History        Past Medical History:     Past Medical History:        Diagnosis  Date         ?  Adverse effect of anesthesia            takes longer to wake up per pt         ?  Fracture            L knee, no surgery         ?  GERD (gastroesophageal reflux disease)       ?  Hypertension       ?  Ill-defined condition            increased cholesterol         ?  Ill-defined condition            osteoporosis/now osteopenia           Past Surgical History:     Past Surgical History:         Procedure  Laterality  Date          ?  HX HYSTERECTOMY         ?  HX ORTHOPAEDIC              sugery for fx right wrist - has titanium          ?  HX ORTHOPAEDIC              L shoulder           Family History:     Family History         Problem  Relation  Age of Onset          ?  Cancer  Mother                colon          ?  Cancer  Sister                melanoma          ?  Cancer   Brother                melanoma          ?  Heart Attack  Brother            ?  Pacemaker  Brother             Social History:  Social History          Tobacco Use         ?  Smoking status:  Never Smoker     ?  Smokeless tobacco:  Never Used       Substance Use Topics         ?  Alcohol use:  Yes              Alcohol/week:  4.0 standard drinks         Types:  4 Glasses of wine per week         ?  Drug use:  Not on file           Allergies:   No Known Allergies           Review of Systems     Review of Systems    Constitutional: Negative for activity change, chills and fever.    HENT: Negative for congestion and sore throat.     Eyes: Negative for pain and redness.    Respiratory: Negative for cough, chest tightness and shortness of breath.     Cardiovascular: Negative for chest pain and palpitations.    Gastrointestinal: Negative for abdominal pain, diarrhea, nausea and vomiting.    Genitourinary: Negative for dysuria, frequency and urgency.    Musculoskeletal: Negative for back pain and neck pain.    Skin: Negative for rash.    Neurological: Negative for dizziness, tremors, seizures, syncope, facial asymmetry, speech difficulty, weakness, light-headedness, numbness and headaches.    Psychiatric/Behavioral: Negative for hallucinations and sleep disturbance.    All other systems reviewed and are negative.           Physical Exam     Physical Exam   Vitals signs and nursing note reviewed.   Constitutional:        General: She is not in acute distress.     Appearance: Normal appearance. She is well-developed. She is not diaphoretic.      Comments: Calm and pleasant elderly white female.    HENT :       Head: Normocephalic and atraumatic.      Right Ear: Tympanic membrane normal.      Left Ear: Tympanic membrane normal.      Nose: Nose normal.   Eyes:       General: No scleral icterus.     Extraocular Movements: Extraocular movements intact.      Conjunctiva/sclera: Conjunctivae normal.      Pupils: Pupils are  equal, round, and reactive to light.      Comments:  Conjunctiva clear bilaterally;     Neck:       Musculoskeletal: Normal range of motion and neck supple.      Thyroid: No thyromegaly.      Vascular: No JVD.      Trachea: No tracheal deviation.   Cardiovascular:       Rate and Rhythm: Normal rate and regular rhythm.      Pulses: Normal pulses.      Heart sounds: No murmur. No friction rub. No gallop.    Pulmonary:       Effort: Pulmonary effort is normal. No respiratory distress.      Breath sounds: Normal breath sounds. No stridor. No wheezing or  rales.   Chest :       Chest wall: No tenderness.   Abdominal :      General: Bowel sounds are normal. There is no distension.  Palpations: Abdomen is soft.      Tenderness: There is no abdominal tenderness. There is no guarding or rebound.     Musculoskeletal: Normal range of motion.          General: No tenderness.    Skin:      General: Skin is warm and dry.      Capillary Refill: Capillary refill takes less than 2 seconds.      Findings: No rash.    Neurological:       General: No focal deficit present.      Mental Status: She is alert and oriented to person, place, and time.      Cranial Nerves: No cranial nerve deficit.      Sensory: No sensory deficit.      Motor: No weakness or abnormal muscle  tone.      Coordination: Coordination normal.      Gait: Gait normal.      Deep Tendon Reflexes: Reflexes normal.      Comments: 5/5 strength throughout; no past pointing; good  heel to shin; negative romberg; holds both arms extended in front of them for 10 seconds without difficulty or drift; lifts legs off of bed without difficulty; no pronator drift; good equal grip strength bilaterally; motor and sensory grossly intact;  no facial asymmetry;       Psychiatric:         Mood and Affect: Mood normal.         Behavior: Behavior normal.                     Diagnostic Study Results        Labs -         Recent Results (from the past 12 hour(s))     CBC WITH AUTOMATED  DIFF          Collection Time: 08/27/18 11:51 AM         Result  Value  Ref Range            WBC  8.6  3.6 - 11.0 K/uL       RBC  4.35  3.80 - 5.20 M/uL       HGB  13.7  11.5 - 16.0 g/dL       HCT  40.3  35.0 - 47.0 %       MCV  92.6  80.0 - 99.0 FL       MCH  31.5  26.0 - 34.0 PG       MCHC  34.0  30.0 - 36.5 g/dL       RDW  12.4  11.5 - 14.5 %       PLATELET  321  150 - 400 K/uL       MPV  10.1  8.9 - 12.9 FL       NRBC  0.0  0 PER 100 WBC       ABSOLUTE NRBC  0.00  0.00 - 0.01 K/uL       NEUTROPHILS  88 (H)  32 - 75 %       LYMPHOCYTES  7 (L)  12 - 49 %       MONOCYTES  5  5 - 13 %       EOSINOPHILS  0  0 - 7 %       BASOPHILS  0  0 - 1 %       IMMATURE GRANULOCYTES  0  0.0 - 0.5 %       ABS. NEUTROPHILS  7.5  1.8 - 8.0 K/UL       ABS. LYMPHOCYTES  0.6 (L)  0.8 - 3.5 K/UL       ABS. MONOCYTES  0.4  0.0 - 1.0 K/UL       ABS. EOSINOPHILS  0.0  0.0 - 0.4 K/UL       ABS. BASOPHILS  0.0  0.0 - 0.1 K/UL       ABS. IMM. GRANS.  0.0  0.00 - 0.04 K/UL       DF  AUTOMATED          METABOLIC PANEL, COMPREHENSIVE          Collection Time: 08/27/18 11:51 AM         Result  Value  Ref Range            Sodium  134 (L)  136 - 145 mmol/L       Potassium  3.7  3.5 - 5.1 mmol/L       Chloride  97  97 - 108 mmol/L       CO2  25  21 - 32 mmol/L       Anion gap  12  5 - 15 mmol/L       Glucose  115 (H)  65 - 100 mg/dL       BUN  15  6 - 20 MG/DL       Creatinine  0.88  0.55 - 1.02 MG/DL       BUN/Creatinine ratio  17  12 - 20         GFR est AA  >60  >60 ml/min/1.60m       GFR est non-AA  >60  >60 ml/min/1.721m      Calcium  9.1  8.5 - 10.1 MG/DL       Bilirubin, total  0.6  0.2 - 1.0 MG/DL       ALT (SGPT)  25  12 - 78 U/L       AST (SGOT)  22  15 - 37 U/L       Alk. phosphatase  78  45 - 117 U/L       Protein, total  7.2  6.4 - 8.2 g/dL       Albumin  4.0  3.5 - 5.0 g/dL       Globulin  3.2  2.0 - 4.0 g/dL       A-G Ratio  1.3  1.1 - 2.2         TROPONIN I          Collection Time: 08/27/18 11:51 AM         Result  Value  Ref  Range            Troponin-I, Qt.  <0.05  <0.05 ng/mL       EKG, 12 LEAD, INITIAL          Collection Time: 08/27/18 11:58 AM         Result  Value  Ref Range            Ventricular Rate  82  BPM       Atrial Rate  82  BPM       P-R Interval  158  ms       QRS Duration  138  ms       Q-T Interval  384  ms  QTC Calculation (Bezet)  448  ms       Calculated P Axis  42  degrees       Calculated R Axis  -10  degrees       Calculated T Axis  149  degrees       Diagnosis                 Normal sinus rhythm   Left bundle branch block   Abnormal ECG   No previous ECGs available          URINALYSIS W/ RFLX MICROSCOPIC          Collection Time: 08/27/18  2:48 PM         Result  Value  Ref Range            Color  YELLOW/STRAW          Appearance  CLEAR  CLEAR         Specific gravity  1.015  1.003 - 1.030         pH (UA)  6.0  5.0 - 8.0         Protein  Negative  NEG mg/dL       Glucose  Negative  NEG mg/dL       Ketone  TRACE (A)  NEG mg/dL       Bilirubin  Negative  NEG         Blood  SMALL (A)  NEG         Urobilinogen  0.2  0.2 - 1.0 EU/dL       Nitrites  Negative  NEG         Leukocyte Esterase  Negative  NEG         URINE MICROSCOPIC ONLY          Collection Time: 08/27/18  2:48 PM         Result  Value  Ref Range            WBC  5-10  0 - 4 /hpf       RBC  5-10  0 - 5 /hpf       Epithelial cells  FEW  FEW /lpf       Bacteria  Negative  NEG /hpf       Mucus  2+  /lpf            Amorphous Crystals  1+ (A)  NEG           Radiologic Studies -      CT HEAD WO CONT       Final Result     IMPRESSION:      No acute intracranial abnormality. Probable mild chronic small vessel ischemic     disease.                      XR CHEST SNGL V       Final Result     IMPRESSION: No acute cardiopulmonary process.                 CT Results   (Last 48 hours)                                    08/27/18 1314    CT HEAD WO CONT  Final result  Impression:    IMPRESSION:       No acute intracranial abnormality. Probable mild chronic  small vessel ischemic      disease.                                     Narrative:    EXAM: CT HEAD WO CONT             INDICATION: memory loss             COMPARISON: None.             CONTRAST: None.             TECHNIQUE: Unenhanced CT of the head was performed using 5 mm images. Brain and      bone windows were generated. Coronal and sagittal reformats. CT dose reduction      was achieved through use of a standardized protocol tailored for this      examination and automatic exposure control for dose modulation.               FINDINGS:      The ventricles and sulci are normal in size, shape and configuration.. Mild      subcortical deep white matter hypodensities. There is no intracranial      hemorrhage, extra-axial collection, or mass effect. The basilar cisterns are      open. No CT evidence of acute infarct.             The bone windows demonstrate no abnormalities. The visualized portions of the      paranasal sinuses and mastoid air cells are clear.                                 CXR Results   (Last 48 hours)                                    08/27/18 1205    XR CHEST SNGL V  Final result            Impression:    IMPRESSION: No acute cardiopulmonary process.                       Narrative:    EXAM: XR CHEST SNGL V             INDICATION: Chest Pain             COMPARISON: 08/17/2017             FINDINGS: A portable AP radiograph of the chest was obtained at 1208 hours. The      patient is on a cardiac monitor. The lungs are clear. The cardiac and      mediastinal contours and pulmonary vascularity are normal.  The bones and soft      tissues are grossly within normal limits.                                        Medical Decision Making     I am the first provider for this patient.      I reviewed the vital signs, available nursing  notes, past medical history, past surgical history, family history and social history.      Vital Signs-Reviewed the patient's vital signs.   Patient Vitals for the past 12  hrs:           Pulse  Resp  BP  SpO2           08/27/18 1401  --  --  134/54  --           08/27/18 1354  79  19  131/72  99 %     08/27/18 1201  --  --  132/75  97 %           08/27/18 1145  --  --  140/52  97 %           Pulse Oximetry Analysis - 97% on RA     Cardiac Monitor:    Rate: 82 bpm   Rhythm: Normal Sinus Rhythm           ED EKG interpretation:11:58   Rhythm: normal sinus rhythm; and regular . Rate (approx.): 82; Axis: normal; PR Interval: normal; QRS interval: Bundle branch block; ST/T wave: non-specific changes;  This EKG was interpreted by Allen Derry MD,ED Provider.         Records Reviewed: Nursing Notes and Old Medical Records      Provider Notes (Medical Decision Making):    DDX: Dementia, CVA, metabolic abnormality, delirium.      ED Course:    Initial assessment performed. The patients presenting problems have been discussed, and they are in agreement with the care plan formulated and outlined with them.  I have encouraged them to ask questions as they arise throughout their visit.                PROGRESS NOTE      Pt reevaluated.  CT head without acute findings.  Only chronic microvascular ischemic changes.  CBC and CMP grossly unremarkable UA negative no bacteria.  Chest x-ray clear.  No neuro deficits.  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  Patient did have  some ketones in her urine suggesting dehydration.  Advised her to follow-up with her PCP.  Patient already on daily aspirin.   Written by Allen Derry, MD       Progress note:      Pt noted to be feeling better , ready for discharge. Updated pt and/or family on all final lab and imaging findings.     Will follow up as instructed. All questions have been answered, pt voiced understanding and agreement with plan.             Specific return precautions provided as well as instructions to return to the ED should sx worsen at any time. Vital signs stable for discharge.          I have also put together some discharge instructions for  him that include: 1) educational information regarding their diagnosis, 2) how to care for their diagnosis at home, as well a 3) list of reasons why they would want to return to the ED prior to  their follow-up appointment, should their condition change.   Written by Allen Derry, MD            Critical Care Time:    0      Disposition:   Discharge      PLAN:   1.      Current Discharge Medication List  2.      Follow-up Information               Follow up With  Specialties  Details  Why  Contact Info              Brandy Cambridge, MD  Family Practice  Schedule an appointment as soon as possible for a visit in 3 days    [address]   [address]   [place] [address]   [phone]                 [facility]  Emergency Medicine  Go in 1 day  If symptoms worsen  [place]   [phone]             Return to ED if worse         Diagnosis        Clinical Impression:       1.  Memory loss or impairment         2.  Dehydration                       Please note that this dictation was completed with Dragon, the computer voice recognition software.  Quite often unanticipated grammatical, syntax, homophones, and other interpretive errors are inadvertently transcribed by the computer software.  Please  disregard these errors.  Please excuse any errors that have escaped final proofreading.

## 2018-08-28 LAB — EKG 12-LEAD
Atrial Rate: 82 {beats}/min
Diagnosis: NORMAL
P Axis: 42 degrees
P-R Interval: 158 ms
Q-T Interval: 384 ms
QRS Duration: 138 ms
QTc Calculation (Bazett): 448 ms
R Axis: -10 degrees
T Axis: 149 degrees
Ventricular Rate: 82 {beats}/min

## 2018-08-28 LAB — EKG, 12 LEAD, INITIAL
Atrial Rate: 82 {beats}/min
Calculated P Axis: 42 degrees
Calculated R Axis: -10 degrees
Calculated T Axis: 149 degrees
Diagnosis: NORMAL
P-R Interval: 158 ms
Q-T Interval: 384 ms
QRS Duration: 138 ms
QTC Calculation (Bezet): 448 ms
Ventricular Rate: 82 {beats}/min

## 2019-03-16 ENCOUNTER — Ambulatory Visit: Payer: Medicare Other | Attending: Internal Medicine

## 2019-03-16 DIAGNOSIS — Z23 Encounter for immunization: Secondary | ICD-10-CM | POA: Insufficient documentation

## 2019-03-16 NOTE — Progress Notes (Signed)
   Covid-19 Vaccination Clinic  Name:  Sydney Webster    MRN: [id number] DOB: 1940/11/27  03/16/2019  Sydney Webster was observed post Covid-19 immunization for 15 minutes without incidence. She was provided with Vaccine Information Sheet and instruction to access the V-Safe system.   Sydney Webster was instructed to call 911 with any severe reactions post vaccine: Marland Kitchen Difficulty breathing  . Swelling of your face and throat  . A fast heartbeat  . A bad rash all over your body  . Dizziness and weakness    Immunizations Administered    Name Date Dose VIS Date Route   Pfizer COVID-19 Vaccine 03/16/2019  2:21 PM 0.3 mL 02/04/2019 Intramuscular   Manufacturer: [employer], [employer]   Lot: [id number]   NDC: [address]-3

## 2019-04-03 ENCOUNTER — Ambulatory Visit: Payer: Medicare Other | Attending: Internal Medicine

## 2019-04-03 DIAGNOSIS — Z23 Encounter for immunization: Secondary | ICD-10-CM | POA: Insufficient documentation

## 2019-04-03 NOTE — Progress Notes (Signed)
   Covid-19 Vaccination Clinic  Name:  Sydney Webster    MRN: [id number] DOB: Mar 20, 1940  04/03/2019  [name] was observed post Covid-19 immunization for 15 minutes without incidence. She was provided with Vaccine Information Sheet and instruction to access the V-Safe system.   [name] was instructed to call 911 with any severe reactions post vaccine: Marland Kitchen Difficulty breathing  . Swelling of your face and throat  . A fast heartbeat  . A bad rash all over your body  . Dizziness and weakness    Immunizations Administered    Name Date Dose VIS Date Route   Pfizer COVID-19 Vaccine 04/03/2019  8:13 AM 0.3 mL 02/04/2019 Intramuscular   Manufacturer: [employer], [employer]   Lot: [id number]   NDC: [address]-3

## 2021-11-14 ENCOUNTER — Encounter

## 2021-11-22 ENCOUNTER — Ambulatory Visit: Payer: MEDICARE | Primary: Family Medicine

## 2021-11-25 ENCOUNTER — Inpatient Hospital Stay: Admit: 2021-11-25 | Payer: MEDICARE | Primary: Family Medicine

## 2021-11-25 DIAGNOSIS — R4182 Altered mental status, unspecified: Secondary | ICD-10-CM
